# Patient Record
Sex: Female | Born: 1970 | Race: White | Hispanic: No | Marital: Married | Smoking: Never smoker
Health system: Southern US, Community
[De-identification: ages and names within clinical notes are randomized; demographics above are authoritative.]

## PROBLEM LIST (undated history)

## (undated) DIAGNOSIS — K5190 Ulcerative colitis, unspecified, without complications: Secondary | ICD-10-CM

## (undated) DIAGNOSIS — Z86018 Personal history of other benign neoplasm: Secondary | ICD-10-CM

## (undated) DIAGNOSIS — J45990 Exercise induced bronchospasm: Secondary | ICD-10-CM

## (undated) HISTORY — DX: Exercise induced bronchospasm: J45.990

## (undated) HISTORY — PX: OTHER SURGICAL HISTORY: SHX169

## (undated) HISTORY — DX: Ulcerative colitis, unspecified, without complications: K51.90

---
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
## 1994-09-03 DIAGNOSIS — K5190 Ulcerative colitis, unspecified, without complications: Secondary | ICD-10-CM

## 1994-09-03 HISTORY — DX: Ulcerative colitis, unspecified, without complications: K51.90

## 1995-09-04 HISTORY — PX: OTHER SURGICAL HISTORY: SHX169

## 2001-09-03 DIAGNOSIS — Z86018 Personal history of other benign neoplasm: Secondary | ICD-10-CM

## 2001-09-03 HISTORY — DX: Personal history of other benign neoplasm: Z86.018

## 2004-08-07 ENCOUNTER — Emergency Department: Payer: Self-pay | Admitting: Emergency Medicine

## 2004-10-07 ENCOUNTER — Emergency Department: Payer: Self-pay | Admitting: Emergency Medicine

## 2005-01-01 HISTORY — PX: TONSILLECTOMY AND ADENOIDECTOMY: SUR1326

## 2005-01-25 ENCOUNTER — Ambulatory Visit: Payer: Self-pay | Admitting: Otolaryngology

## 2005-04-11 ENCOUNTER — Ambulatory Visit: Payer: Self-pay | Admitting: Otolaryngology

## 2005-08-23 ENCOUNTER — Encounter (INDEPENDENT_AMBULATORY_CARE_PROVIDER_SITE_OTHER): Payer: Self-pay | Admitting: Specialist

## 2005-08-23 ENCOUNTER — Observation Stay (HOSPITAL_COMMUNITY): Admission: RE | Admit: 2005-08-23 | Discharge: 2005-08-24 | Payer: Self-pay | Admitting: Gynecology

## 2006-11-11 ENCOUNTER — Ambulatory Visit: Payer: Self-pay | Admitting: Gynecology

## 2007-01-28 ENCOUNTER — Ambulatory Visit: Payer: Self-pay | Admitting: Gynecology

## 2007-01-28 ENCOUNTER — Ambulatory Visit (HOSPITAL_COMMUNITY): Admission: RE | Admit: 2007-01-28 | Discharge: 2007-01-28 | Payer: Self-pay | Admitting: Gynecology

## 2007-05-30 LAB — HM DEXA SCAN: HM DEXA SCAN: NORMAL

## 2007-07-26 ENCOUNTER — Emergency Department: Payer: Self-pay | Admitting: Emergency Medicine

## 2007-08-04 HISTORY — PX: PARTIAL HYSTERECTOMY: SHX80

## 2007-11-03 ENCOUNTER — Ambulatory Visit: Payer: Self-pay | Admitting: Gynecology

## 2008-04-30 ENCOUNTER — Emergency Department: Payer: Self-pay | Admitting: Emergency Medicine

## 2008-11-04 ENCOUNTER — Emergency Department: Payer: Self-pay | Admitting: Emergency Medicine

## 2009-10-05 ENCOUNTER — Ambulatory Visit: Payer: Self-pay | Admitting: Family Medicine

## 2011-01-16 NOTE — Assessment & Plan Note (Signed)
NAMEMELAYNA, ROBARTS NO.:  [id number]   MEDICAL RECORD NO.:  [id number]          PATIENT TYPE:  POB   LOCATION:  [place] [facility] [place]:  Darron Doom, MD        DATE OF BIRTH:  29-Nov-1970   DATE OF SERVICE:  10/05/2009                                  CLINIC NOTE   CHIEF COMPLAINT:  Yearly exam and breast issue.   HISTORY OF PRESENT ILLNESS:  The patient is a 40 year old, para 2, who  is an [place].  The patient has a complicated medical history that  includes colectomy and hysterectomy.  She does have history of abnormal  Pap with HPV in the past.  The patient comes in today mostly to complain  about this area of her breast.  She has fibrocystic breast change that  she has noted.  She has been getting mammography yearly since age 15.  However, her last mammogram was in May 2009.  She missed 2010 for some  reason.  The patient has noted some pain and increase in nodularity at  the 12 o'clock position of her left breast.  There is fibrocystic change  there which she says on feeling it is more round and firm there that has  her worry.  The patient is also interested in BRCA testing.  The patient  has a family history of breast cancer in her grandmother, who passed at  age 63 from breast cancer.  Her mother also had a double mastectomy for  benign tumors at age 51.  There is also a family history of colon cancer  and melanoma.   PAST MEDICAL HISTORY:  Significant for allergies, anxiety, and  depression, ulcerative colitis.   PAST SURGICAL HISTORY:  She has had varicose vein repair, colectomy,  hysterectomy, vaginal cuff revision, tonsillectomy, sinus surgery.   MEDICATIONS:  1. She is on Zyrtec 1 p.o. daily.  2. Trazodone 1 p.o. daily.  3. Zoloft 1 p.o. daily.  4. Lomotil 1 p.o. daily.  5. Singulair 1 p.o. daily.   ALLERGIES:  Multiple and to SULFA, DEMEROL, BENTYL, BETADINE, and  FLAGYL.   OBSTETRICAL HISTORY:   She has had 2 deliveries.  She is status post  tubal following the birth of her last child.   GYNECOLOGIC HISTORY:  History of abnormal Pap with HPV multiple  occasions.  She is status post TVH with normal Pap.  However, with a  history of HPV probably consider continued Pap smears for the next 20  years.   FAMILY HISTORY:  For breast cancer in the young age in her grandmother,  bilateral mastectomy by her mom for tumors that more likely benign,  history of melanoma, history of colon cancer, strong history of  ulcerative colitis.   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  Her husband teaches Haematologist  at [employer].   REVIEW OF SYSTEMS:  Review is negative for fever, chills, headache,  vision changes, shortness of breath, chest pain, chronic abdominal pain,  some mild fecal incontinence at times, and she  has had ileoanal pull-  through.  Varicose veins as mentioned above.  No dysuria, no melanoma.   PHYSICAL EXAMINATION:  VITAL SIGNS:  Today, her weight is 117, blood  pressure 118/77, pulse 83.  GENERAL:  She is a well-developed, well-nourished female, in no acute  distress.  HEENT:  Normocephalic, atraumatic.  Sclerae anicteric.  NECK:  Supple.  Normal thyroid.  LUNGS:  Clear bilaterally.  CV:  Regular rate and rhythm.  No rubs, gallops, or murmurs.  ABDOMEN:  Soft, nontender, nondistended.  Well-healed midline incision  is noted.  EXTREMITIES:  No cyanosis, clubbing, or edema.  BREASTS:  Symmetric with everted nipples.  She has diffuse fibrocystic  change left greater than right breast.  There is a discrete 1 x 1 cm  firm mobile, rubbery mass noted at approximately 12 o'clock  position.  It has heartening to say this is from surrounding fibrocystic change  except that the patient notices it.  There is no supraclavicular or  axillary adenopathy noted.  GU:  Normal external female genitalia.  BUS is  normal.  Vagina is pink  and rugated.  Cervix and uterus are absent.  No adnexal mass or  tenderness were noted.  The ovaries cannot be felt constant.   IMPRESSION:  1. Yearly exam.  2. Breast mass.  3. Strong family history of cancer, Ashkenazi Jew ancestry.  4. History of abnormal Pap.   PLAN:  1. BRCA testing today.  2. Pap smear should be done for 20 years post hysterectomy.  3. Diagnostic mammogram.  Consider MRI if this comes back normal.  We      will follow up on these results and order subsequent tests as      necessary.           ______________________________  Darron Doom, MD     TP/MEDQ  D:  10/05/2009  T:  10/06/2009  Job:  [id number]

## 2011-01-19 NOTE — Discharge Summary (Signed)
NAMEBLAYKLEE, MABLE NO.:  [id number]   MEDICAL RECORD NO.:  [id number]          PATIENT TYPE:  OBV   LOCATION:  6789                          FACILITY:  [facility]   PHYSICIAN:  Willey Blade, MD  DATE OF BIRTH:  1970/12/22   DATE OF ADMISSION:  08/23/2005  DATE OF DISCHARGE:  08/24/2005                                 DISCHARGE SUMMARY   REASON FOR HOSPITALIZATION:  Menometrorrhagia, severe dysmenorrhea.   PROCEDURE:  Total vaginal hysterectomy with preservation of both tubes and  ovaries.   FINAL DIAGNOSIS:  Menometrorrhagia, severe dysmenorrhea.   HOSPITAL COURSE:  This is a 40 year old multiparous Caucasian female who  underwent the aforementioned procedure on August 23, 2005.  The patient  had appropriate work up for menometrorrhagia.  Intraoperative course was  unremarkable. Postoperatively the patient's course was uneventful.  She was  afebrile.  The patient voided well and catheter was removed on the same  postoperative day per patient request.  Hemoglobin was 9.6 and hematocrit  28.1 postoperatively.  Minimal pelvic pain reported by patient.  No vaginal  bleeding. Abdomen was soft.  Calves without tenderness.  Lungs were clear.   The patient was discharged with routine instructions including contacting  the office for temperature elevation about 100.52F, increasing abdomen or  vaginal pain, difficulty with bowel movements or genitourinary symptoms.  Percocet 5/500, one to two every 4 to 6 hours prescribed for pain.  Phenergan 25 mg one-half to one tablet every 4 to 6 hours prescribed for  nausea and vomiting.  She will return to the office in four to five weeks to  have her postoperative evaluation.      Willey Blade, MD  Electronically Signed     SHB/MEDQ  D:  08/24/2005  T:  08/25/2005  Job:  [id number]

## 2011-01-19 NOTE — Op Note (Signed)
Mariah Nguyen, Mariah Nguyen NO.:  [id number]   MEDICAL RECORD NO.:  [id number]          PATIENT TYPE:  OBV   LOCATION:  9399                          FACILITY:  [facility]   PHYSICIAN:  Willey Blade, MD  DATE OF BIRTH:  05-16-1971   DATE OF PROCEDURE:  08/23/2005  DATE OF DISCHARGE:                                 OPERATIVE REPORT   PREOPERATIVE DIAGNOSIS:  Menometrorrhagia   POSTOPERATIVE DIAGNOSIS:  Menometrorrhagia   PROCEDURE:  Total vaginal hysterectomy, preservation of both tubes and  ovaries.   SURGEON:  Willey Blade, M.D.   ASSISTANT:  None.   COMPLICATIONS:  None immediate.   ESTIMATED BLOOD LOSS:  Less than 50 mL.   SPECIMEN:  Uterus, cervix.   ANESTHESIA:  Spinal with 0.5% Marcaine with epinephrine 1:200,000, 30 mL  paracervical block.   OPERATIVE FINDINGS:  Uterus was 6-9 weeks in size, globular. Both adnexa  were normal.   OPERATIVE PROCEDURE:  The patient prepped and draped in the usual fashion  and placed in the lithotomy position, Betadine solution used for antiseptic  and the patient was catheterized prior to procedure. After adequate spinal  analgesia, paracervical block was administered. The anterior posterior  vaginal epithelium were incised transversely. Peritoneal reflections were  carefully identified and opened without injury to the respective organs.  Uterosacral cardinal ligament complexes clamped, cut and ligated with 0  Vicryl suture. This extended to the uterine vasculature with its ascending  branches. Round ligaments taken separately. Utero-ovarian ligaments  transfixated with 0 Vicryl sutures twice. Bleeding points hemostatically  checked. Blood clots removed. Closure of the cuff in one layer including  peritoneum with 0 Vicryl running interlocking suture from either end to  midline. The patient tolerated the procedure well, returned to post  anesthesia recovery room in excellent condition.      Willey Blade,  MD  Electronically Signed     SHB/MEDQ  D:  08/23/2005  T:  08/24/2005  Job:  [id number]

## 2011-01-19 NOTE — H&P (Signed)
Mariah Nguyen, Mariah Nguyen NO.:  [id number]   MEDICAL RECORD NO.:  [id number]          PATIENT TYPE:  AMB   LOCATION:  [place]                           FACILITY:  [facility]   PHYSICIAN:  Willey Blade, MD  DATE OF BIRTH:  05/22/1971   DATE OF ADMISSION:  DATE OF DISCHARGE:                                HISTORY & PHYSICAL   DATE OF SURGERY:  Scheduled for August 23, 2005.   REASON FOR HOSPITALIZATION:  Menometrorrhagia.   IN HOSPITAL PROPOSED PROCEDURE:  Total vaginal hysterectomy with  preservation of both tubes and ovaries.   HOSPITAL COURSE:  This patient is a 40 year old gravida 3, para 2-0-1-2  Caucasian female admitted for a total vaginal hysterectomy and preservation  of both tubes and ovaries, because of menometrorrhagia and severe  dysmenorrhea.  The patient complains of menses lasting 8 to 12 days in a 30-  day calendar month.  The patient states that she has been unable to utilize  oral contraceptives in management due to headaches and nausea.  An  intrauterine device, Mirena,  has been unsuccessful at managing bleeding.  She has taken a three month course of norethindrone acetate without benefit.  Transvaginal ultrasound demonstrates no evidence for intramural pathology  and SIS was negative for intracavitary lesions. Endometrial biopsy was  negative for neoplasia or hyperplasia.  The patient takes no medications to  enhance her bleeding propensity and has no personal or family history of  bleeding diatheses.   OBSTETRIC/GYNECOLOGIC HISTORY:  The patient has had two cesarean section and  one dilatation curettage for a first trimester incomplete abortion.  Method  of birth is control bilateral tubal ligation.   ALLERGIES:  SULFA, DEMEROL, IODINE, BENTYL AND FLAGYL.   CURRENT MEDICATIONS:  1.  Zoloft 18 mg daily.  2.  Trazodone 75 mg at night as needed.  3.  Zyrtec 10 mg as needed.  4.  Cipro 250 mg as needed for control of diarrhea.   MEDICAL  HISTORY:  The patient has had ulcerative colitis in the past.   SURGICAL HISTORY:  Total colectomy with rectal pouch preservation in 1997.   SOCIAL HISTORY:  Denies alcohol, drug abuse, or smoking.   FAMILY HISTORY:  Negative for first- degree relatives with breast, colon,  ovarian, or uterine carcinoma.   REVIEW OF SYSTEMS:  Negative.   PHYSICAL EXAMINATION:  VITAL SIGNS:  Blood pressure 104/64, pulse 76 and  regular, weight 114 pounds, height 5 feet, 3 inches.  HEENT:  Grossly normal.  BREAST EXAMINATION:  Without mass or discharge, thickening or tenderness.  CHEST:  Clear to percussion and auscultation.  CARDIOVASCULAR EXAMINATION:  Without murmurs or enlargements.  Regular rate  and rhythm.  VASCULAR, EXTREMITY, LYMPHATIC, NEUROLOGICAL, MUSCULOSKELETAL SYSTEMS, AND  SKIN:  All normal.  ABDOMEN:  Soft without gross hepatosplenomegaly.  PELVIC EXAMINATION:  External genitalia, vulva and vagina normal.  Cervix  smooth without erosions or lesions.  Uterus is small, anteverted and flexed.  Both adnexa palpable and found to be normal.  RECTAL EXAMINATION:  Hemoccult negative without masses.  Pap smear normal.   IMPRESSION:  Menometrorrhagia with severe dysmenorrhea.   PLAN:  The patient utilizes occasional Vicodin for pain, but otherwise has  been unresponsive to medical management for abnormal bleeding.  Patient  declines the use of an endometrial thermal balloon ablation, and/or a  NovaSure technique.  She has no desire for further childbearing, and wishes  to have complete cessation of the bleeding and cramping.  The patient has  utilized nonsteroidal anti-inflammatory agents which upset her stomach, and  therefore minimizes said use.  A total vaginal hysterectomy with  preservation of both tubes and ovaries was agreed to and will be performed  for said patient.  Risks, including possible injuries to ureter, bowel and  bladder, possible conversion to a laparoscopic or open  procedure, hemorrhage  possibly requiring a blood transfusion, infection, pulmonary complications,  and other unforeseen complications, discussed understood by said patient.      Willey Blade, MD  Electronically Signed     SHB/MEDQ  D:  08/21/2005  T:  08/21/2005  Job:  [id number]

## 2011-01-19 NOTE — Op Note (Signed)
Mariah Nguyen, Mariah Nguyen NO.:  [id number]   MEDICAL RECORD NO.:  [id number]          PATIENT TYPE:  AMB   LOCATION:  [place]                           FACILITY:  [facility]   PHYSICIAN:  Willey Blade, MD  DATE OF BIRTH:  [birth year]   DATE OF PROCEDURE:  01/28/2007  DATE OF DISCHARGE:                               OPERATIVE REPORT   PREOPERATIVE DIAGNOSIS:  Vaginal cuff granulation tissue.   POSTOPERATIVE DIAGNOSIS:  Vaginal cuff granulation tissue.   PROCEDURE:  Fulguration of vaginal cuff granulation tissue.   SURGEON:  Willey Blade, MD   ASSISTANT:  None.   COMPLICATIONS:  None immediate.   ESTIMATED BLOOD LOSS:  Minimal.   SPECIMEN:  None.   OPERATIVE FINDINGS:  External genitalia, vulva and vagina appeared  grossly normal.  Vaginal cuff suture line granulation tissue noted along  the edge of said cuff secondary to previous total vaginal hysterectomy.   OPERATIVE PROCEDURE:  The patient was prepped and draped in the usual  fashion and placed in the lithotomy position.  Hibiclens was used for  antiseptic.  After placing a speculum in the vagina, cuff tissue was  cauterized.  The surface was then wiped clean with lactated Ringer's to  avoid eschar, no active bleeding noted.  The patient tolerated the  procedure well and returned to post anesthesia recovery room in  excellent condition.      Willey Blade, MD  Electronically Signed     SHB/MEDQ  D:  01/28/2007  T:  01/28/2007  Job:  [id number]

## 2011-06-22 DIAGNOSIS — K91850 Pouchitis: Secondary | ICD-10-CM | POA: Insufficient documentation

## 2012-01-10 ENCOUNTER — Emergency Department: Payer: Self-pay | Admitting: Emergency Medicine

## 2012-01-10 LAB — CBC
HCT: 50.9 % — ABNORMAL HIGH (ref 35.0–47.0)
HGB: 16.8 g/dL — ABNORMAL HIGH (ref 12.0–16.0)
MCHC: 33 g/dL (ref 32.0–36.0)
RDW: 13.6 % (ref 11.5–14.5)
WBC: 17.5 10*3/uL — ABNORMAL HIGH (ref 3.6–11.0)

## 2012-01-10 LAB — COMPREHENSIVE METABOLIC PANEL
Albumin: 4.5 g/dL (ref 3.4–5.0)
Anion Gap: 10 (ref 7–16)
Calcium, Total: 9.3 mg/dL (ref 8.5–10.1)
Chloride: 103 mmol/L (ref 98–107)
Creatinine: 1.13 mg/dL (ref 0.60–1.30)
EGFR (Non-African Amer.): 60 — ABNORMAL LOW
SGOT(AST): 45 U/L — ABNORMAL HIGH (ref 15–37)
SGPT (ALT): 29 U/L
Sodium: 133 mmol/L — ABNORMAL LOW (ref 136–145)

## 2012-01-11 ENCOUNTER — Emergency Department: Payer: Self-pay | Admitting: Internal Medicine

## 2012-01-11 LAB — URINALYSIS, COMPLETE
Bacteria: NONE SEEN
Blood: NEGATIVE
Leukocyte Esterase: NEGATIVE
Nitrite: NEGATIVE
Ph: 5 (ref 4.5–8.0)
Protein: NEGATIVE
RBC,UR: <1 /HPF (ref 0–5)

## 2012-01-11 LAB — CBC
HGB: 13.2 g/dL (ref 12.0–16.0)
MCHC: 32.9 g/dL (ref 32.0–36.0)
RBC: 4.73 10*6/uL (ref 3.80–5.20)
WBC: 11.5 10*3/uL — ABNORMAL HIGH (ref 3.6–11.0)

## 2012-01-11 LAB — COMPREHENSIVE METABOLIC PANEL
Albumin: 3.3 g/dL — ABNORMAL LOW (ref 3.4–5.0)
Anion Gap: 7 (ref 7–16)
Calcium, Total: 7.9 mg/dL — ABNORMAL LOW (ref 8.5–10.1)
Creatinine: 0.9 mg/dL (ref 0.60–1.30)
Osmolality: 274 (ref 275–301)
SGOT(AST): 20 U/L (ref 15–37)
SGPT (ALT): 21 U/L

## 2012-01-11 LAB — OCCULT BLOOD X 1 CARD TO LAB, STOOL: Occult Blood, Feces: POSITIVE

## 2012-01-13 LAB — STOOL CULTURE

## 2012-07-01 DIAGNOSIS — Z9109 Other allergy status, other than to drugs and biological substances: Secondary | ICD-10-CM | POA: Insufficient documentation

## 2012-07-15 HISTORY — PX: OTHER SURGICAL HISTORY: SHX169

## 2012-10-18 ENCOUNTER — Emergency Department: Payer: Self-pay | Admitting: Emergency Medicine

## 2012-10-18 LAB — CBC
HCT: 47.8 % — ABNORMAL HIGH (ref 35.0–47.0)
HGB: 15.8 g/dL (ref 12.0–16.0)
MCH: 28.5 pg (ref 26.0–34.0)
MCV: 86 fL (ref 80–100)
RBC: 5.56 10*6/uL — ABNORMAL HIGH (ref 3.80–5.20)
RDW: 13.8 % (ref 11.5–14.5)
WBC: 10.4 10*3/uL (ref 3.6–11.0)

## 2012-10-18 LAB — COMPREHENSIVE METABOLIC PANEL
Albumin: 3.9 g/dL (ref 3.4–5.0)
Anion Gap: 7 (ref 7–16)
Bilirubin,Total: 0.4 mg/dL (ref 0.2–1.0)
Calcium, Total: 9 mg/dL (ref 8.5–10.1)
EGFR (Non-African Amer.): >60
Osmolality: 274 (ref 275–301)
Potassium: 4.3 mmol/L (ref 3.5–5.1)
SGOT(AST): 35 U/L (ref 15–37)
SGPT (ALT): 28 U/L (ref 12–78)
Total Protein: 8.3 g/dL — ABNORMAL HIGH (ref 6.4–8.2)

## 2012-10-18 LAB — URINALYSIS, COMPLETE
Bacteria: NONE SEEN
Bilirubin,UR: NEGATIVE
Ketone: NEGATIVE
Ph: 6 (ref 4.5–8.0)
RBC,UR: <1 /HPF (ref 0–5)
Specific Gravity: 1.006 (ref 1.003–1.030)
Squamous Epithelial: NONE SEEN

## 2013-01-08 DIAGNOSIS — Z8719 Personal history of other diseases of the digestive system: Secondary | ICD-10-CM | POA: Insufficient documentation

## 2013-01-08 DIAGNOSIS — K5100 Ulcerative (chronic) pancolitis without complications: Secondary | ICD-10-CM | POA: Insufficient documentation

## 2013-02-11 LAB — HM MAMMOGRAPHY

## 2013-02-19 DIAGNOSIS — Z803 Family history of malignant neoplasm of breast: Secondary | ICD-10-CM | POA: Insufficient documentation

## 2013-10-19 DIAGNOSIS — K529 Noninfective gastroenteritis and colitis, unspecified: Secondary | ICD-10-CM | POA: Insufficient documentation

## 2014-01-21 DIAGNOSIS — I493 Ventricular premature depolarization: Secondary | ICD-10-CM | POA: Insufficient documentation

## 2015-06-09 ENCOUNTER — Ambulatory Visit (INDEPENDENT_AMBULATORY_CARE_PROVIDER_SITE_OTHER): Payer: BLUE CROSS/BLUE SHIELD | Admitting: Family Medicine

## 2015-06-09 ENCOUNTER — Encounter: Payer: Self-pay | Admitting: Family Medicine

## 2015-06-09 ENCOUNTER — Other Ambulatory Visit: Payer: Self-pay | Admitting: Family Medicine

## 2015-06-09 ENCOUNTER — Other Ambulatory Visit: Payer: Self-pay

## 2015-06-09 ENCOUNTER — Telehealth: Payer: Self-pay | Admitting: Family Medicine

## 2015-06-09 VITALS — BP 104/76 | HR 91 | Temp 98.0°F | Resp 16 | Ht 63.5 in | Wt 130.4 lb

## 2015-06-09 DIAGNOSIS — I4891 Unspecified atrial fibrillation: Secondary | ICD-10-CM | POA: Insufficient documentation

## 2015-06-09 DIAGNOSIS — G4700 Insomnia, unspecified: Secondary | ICD-10-CM | POA: Insufficient documentation

## 2015-06-09 DIAGNOSIS — J45909 Unspecified asthma, uncomplicated: Secondary | ICD-10-CM | POA: Insufficient documentation

## 2015-06-09 DIAGNOSIS — G43909 Migraine, unspecified, not intractable, without status migrainosus: Secondary | ICD-10-CM | POA: Insufficient documentation

## 2015-06-09 DIAGNOSIS — F32A Depression, unspecified: Secondary | ICD-10-CM | POA: Insufficient documentation

## 2015-06-09 DIAGNOSIS — F419 Anxiety disorder, unspecified: Secondary | ICD-10-CM | POA: Insufficient documentation

## 2015-06-09 DIAGNOSIS — J4520 Mild intermittent asthma, uncomplicated: Secondary | ICD-10-CM

## 2015-06-09 DIAGNOSIS — K51019 Ulcerative (chronic) pancolitis with unspecified complications: Secondary | ICD-10-CM

## 2015-06-09 DIAGNOSIS — J45990 Exercise induced bronchospasm: Secondary | ICD-10-CM

## 2015-06-09 DIAGNOSIS — I4892 Unspecified atrial flutter: Secondary | ICD-10-CM

## 2015-06-09 DIAGNOSIS — IMO0002 Reserved for concepts with insufficient information to code with codable children: Secondary | ICD-10-CM | POA: Insufficient documentation

## 2015-06-09 DIAGNOSIS — F329 Major depressive disorder, single episode, unspecified: Secondary | ICD-10-CM | POA: Insufficient documentation

## 2015-06-09 MED ORDER — ALBUTEROL SULFATE HFA 108 (90 BASE) MCG/ACT IN AERS: 2.0000 | INHALATION_SPRAY | Freq: Four times a day (QID) | RESPIRATORY_TRACT | Status: DC | PRN

## 2015-06-09 MED ORDER — BECLOMETHASONE DIPROPIONATE 40 MCG/ACT IN AERS: 2.0000 | INHALATION_SPRAY | Freq: Two times a day (BID) | RESPIRATORY_TRACT | Status: DC | PRN

## 2015-06-09 NOTE — Patient Instructions (Signed)
Exercise-Induced Asthma  Asthma is a condition in which the airways in the lungs (bronchioles) tend to constrict more than normal due to muscle spasms. This constriction results in difficulty in breathing (shortness of breath, wheezing, or coughing). For some people the symptoms are caused or triggered by physical activity; this is known as exercise-induced asthma. SYMPTOMS   Shortness of breath.  Wheezing.  Coughing.  Chest tightness.  Decrease in optimal performance.  Fatigue. POSSIBLE TRIGGERS: Exercise-induced asthma may occur more often when one or more of the following are present:   Animal dander from the skin, hair, or feathers of animals.  Dust mites contained in house dust.  Cockroaches.  Pollen from trees or grass.  Mold.  Cigarette or tobacco smoke. Smoking cannot be allowed in homes of people with asthma. People with asthma should not smoke and should not be around smokers.  Air pollutants such as dust, household cleaners, hair sprays, aerosol sprays, paint fumes, strong chemicals, or strong odors.  Cold air or weather changes. Cold air may cause inflammation. Winds increase molds and pollens in the air. There is not one best climate for people with asthma.  Strong emotions, such as crying or laughing hard.  Stress.  Certain medicines, such as aspirin or beta-blockers.  Sulfites in foods and drinks, such as dried fruits and wine.  Infections or inflammatory conditions such as the flu, a cold, or an inflammation of the nasal membranes (rhinitis).  Gastroesophageal reflux disease (GERD). GERD is a condition where stomach acid backs up into your throat (esophagus).  Exercise or strenuous activity. Proper pre-exercise medicines allow most people to participate in sports. PREVENTION   Know the triggers that may increase your occurrence for exercise-induced asthma and avoid them.  During winter you may need to exercise indoors or wear a mask if you do  exercise outdoors.  Breathing through the nose instead of the mouth, especially in the winter.  Warm up for an appropriate length of time before a vigorous workout.  Take controller and reliever medicines to control your asthma as directed.  Follow up with your caregiver as directed. TREATMENT  Asthma controller and reliever medicines work well for most people suffering from exercise-induced asthma. Medicines are able to prevent asthma attacks as well as treat attacks already happening. The most common type of medicine for asthma is called a bronchodilator. Bronchodilators act by expanding the constricted airways. The most common type of bronchodilator is albuterol and should be taken 15 to 30 minutes before physical activity and as soon as symptoms begin to appear. Additional medicines, such as cromolyn and nedocromil, may be prescribed by your caregiver. It is important for all people with asthma to use their medicines as directed by their caregiver.   This information is not intended to replace advice given to you by your health care provider. Make sure you discuss any questions you have with your health care provider.   Document Released: 08/20/2005 Document Revised: 09/10/2014 Document Reviewed: 12/02/2008 Elsevier Interactive Patient Education [employer].

## 2015-06-09 NOTE — Telephone Encounter (Signed)
Last OV 11/2012  Thanks,   -Mickel Baas

## 2015-06-09 NOTE — Progress Notes (Signed)
Patient ID: SHANIQUIA BRAFFORD, female   DOB: 1971-01-19, 44 y.o.   MRN: [id number] Name: Mariah Nguyen   MRN: [id number]    DOB: [birth year]   Date:06/09/2015       Progress Note  Subjective  Chief Complaint  Chief Complaint  Patient presents with  . Asthma  . Medication Refill    Asthma She complains of cough, shortness of breath and wheezing. This is a chronic problem. The current episode started yesterday (after playing tennis and being exposed to bleach after the maid service cleaned the bathroom). The problem occurs intermittently. The problem has been gradually improving. The cough is non-productive. Pertinent negatives include no ear pain, fever, headaches, nasal congestion, postnasal drip, rhinorrhea or sore throat. Her symptoms are aggravated by exercise and pollen. Her symptoms are alleviated by steroid inhaler and beta-agonist. She reports significant improvement on treatment. Her past medical history is significant for asthma. 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   Past Surgical History  Procedure Laterality Date  . Total proctocolectomy with j-pouch  1997    History of recurrent pouchitis  . Partial hysterectomy  08/2007  . Tonsillectomy and adenoidectomy  01/2005  . Cesarean section  2002  . Right eye surgery  07/15/12    [facility]  . Stripping of varicose veins in legs      Patient Active Problem List   Diagnosis Date Noted  . Asthma 06/09/2015  . Anxiety 06/09/2015  . Clinical depression 06/09/2015  . Cannot sleep 06/09/2015  . Migraine 06/09/2015  . 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 Beat, premature ventricular 01/21/2014  . Bowel disease, inflammatory 10/19/2013  . Family history of breast cancer 02/19/2013  . Chronic ulcerative enterocolitis ([place]) 01/08/2013  . Allergy to environmental factors 07/01/2012  . 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  Social History  Substance  Use Topics  . Smoking status: Never Smoker   . Smokeless tobacco: Never Used  . Alcohol Use: 0.0 oz/week    0 Standard drinks or equivalent per week     Comment: Occasionally    Current outpatient prescriptions:  .  Adalimumab (HUMIRA [place]), Inject into the skin., Disp: , Rfl:  .  albuterol (VENTOLIN HFA) 108 (90 BASE) MCG/ACT inhaler, Inhale into the lungs., Disp: , Rfl:  .  ALPRAZolam (XANAX) 0.5 MG tablet, Take by mouth., Disp: , Rfl:  .  beclomethasone (QVAR) 40 MCG/ACT inhaler, Inhale into the lungs., Disp: , Rfl:  .  busPIRone (BUSPAR) 15 MG tablet, Take by mouth., Disp: , Rfl:  .  cetirizine (ZYRTEC ALLERGY) 10 MG tablet, Take by mouth., Disp: , Rfl:  .  cyclobenzaprine (FLEXERIL) 10 MG tablet, Take by mouth., Disp: , Rfl:  .  cycloSPORINE (RESTASIS) 0.05 % ophthalmic emulsion, Apply to eye., Disp: , Rfl:  .  diphenoxylate-atropine (LOMOTIL) 2.5-0.025 MG tablet, Take by mouth., Disp: , Rfl:  .  EPINEPHrine (EPIPEN 2-PAK) 0.3 mg/0.3 mL IJ SOAJ injection, EPIPEN 2-PAK, 0.3MG /0.3ML (Injection Solution Auto-injector)  1 (one) Soln Auto-inj as directed for 0 days  Quantity: 1;  Refills: 0   Ordered :13-Jan-2015  Margarita Rana MD;  Started 13-Jan-2015 Active Comments: Needs ov scheduled. Thanks- Dr. Jerilynn Mages., Disp: , Rfl:  .  metroNIDAZOLE (FLAGYL) 500 MG tablet, Take by mouth., Disp: , Rfl:  .  montelukast (SINGULAIR) 10 MG tablet, Take by mouth., Disp: , Rfl:  .  promethazine (PHENERGAN) 25 MG tablet, Take by mouth., Disp: ,  Rfl:  .  sertraline (ZOLOFT) 25 MG tablet, Take by mouth., Disp: , Rfl:  .  sertraline (ZOLOFT) 50 MG tablet, Take by mouth., Disp: , Rfl:  .  traZODone (DESYREL) 100 MG tablet, Take by mouth., Disp: , Rfl:   Allergies  Allergen Reactions  . Bentyl  [Dicyclomine]   . Meperidine   . Metronidazole   . Povidone Iodine   . Sulfa Antibiotics    Review of Systems  Constitutional: Negative.  Negative for fever.  HENT: Negative.  Negative for ear pain, postnasal drip,  rhinorrhea and sore throat.   Eyes: Negative.   Respiratory: Positive for cough, shortness of breath and wheezing.   Cardiovascular: Negative.   Gastrointestinal: Negative.   Genitourinary: Negative.   Musculoskeletal: Negative.   Neurological: Negative.  Negative for headaches.  Endo/Heme/Allergies: Negative.   Psychiatric/Behavioral: Negative.    Objective  Filed Vitals:   06/09/15 1013  BP: 104/76  Pulse: 91  Temp: 98 F (36.7 C)  TempSrc: Oral  Resp: 16  Height: 5' 3.5" (1.613 m)  Weight: 130 lb 6.4 oz (59.149 kg)  SpO2: 95%  Body mass index is 22.73 kg/(m^2).  Physical Exam  Constitutional: She is oriented to person, place, and time and well-developed, well-nourished, and in no distress.  HENT:  Head: Normocephalic.  Right Ear: External ear normal.  Left Ear: External ear normal.  Mouth/Throat: Oropharynx is clear and moist.  Eyes: Conjunctivae and EOM are normal.  Neck: Normal range of motion. Neck supple.  Cardiovascular: Normal rate and regular rhythm.   Pulmonary/Chest: Effort normal and breath sounds normal.  Abdominal: Soft. Bowel sounds are normal.  Musculoskeletal: Normal range of motion.  Lymphadenopathy:    She has no cervical adenopathy.  Neurological: She is alert and oriented to person, place, and time.  Skin: No rash noted.   Assessment & Plan  1. Exercise-induced asthma with acute exacerbation Recent flare with increase in exercise (playing tennis) and exposure to bleach vapors. Ventolin she had at home was expired and did not give any relief. Found another inhaler at home and feeling better today. Will refill Ventolin and QVAR (reminded her to rinse mouth out after use of ICS). Recheck prn. - albuterol (VENTOLIN HFA) 108 (90 BASE) MCG/ACT inhaler; Inhale 2 puffs into the lungs every 6 (six) hours as needed for wheezing or shortness of breath.  Dispense: 1 Inhaler; Refill: 2 - beclomethasone (QVAR) 40 MCG/ACT inhaler; Inhale 2 puffs into the lungs  2 (two) times daily as needed.  Dispense: 1 Inhaler; Refill: 2  2. Chronic ulcerative enterocolitis, unspecified complication (HCC) Diagnosed with ulcerative colitis in 1996 and required total proctocolectomy with J-pouch in 1997. 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 Has had TB test at the onset of Humira treatment. Continue regular follow up with gastroenterologist. Always cautioned about infection issues. Recheck as needed.

## 2015-06-10 ENCOUNTER — Ambulatory Visit: Payer: Self-pay | Admitting: Family Medicine

## 2015-07-19 ENCOUNTER — Other Ambulatory Visit: Payer: Self-pay | Admitting: Family Medicine

## 2015-07-19 DIAGNOSIS — J4520 Mild intermittent asthma, uncomplicated: Secondary | ICD-10-CM

## 2015-07-20 ENCOUNTER — Other Ambulatory Visit: Payer: Self-pay | Admitting: Family Medicine

## 2015-07-20 DIAGNOSIS — Z9109 Other allergy status, other than to drugs and biological substances: Secondary | ICD-10-CM

## 2015-09-20 ENCOUNTER — Other Ambulatory Visit: Payer: Self-pay

## 2015-09-27 ENCOUNTER — Encounter: Payer: Self-pay | Admitting: Physician Assistant

## 2015-09-27 ENCOUNTER — Ambulatory Visit (INDEPENDENT_AMBULATORY_CARE_PROVIDER_SITE_OTHER): Payer: BLUE CROSS/BLUE SHIELD | Admitting: Physician Assistant

## 2015-09-27 VITALS — BP 112/60 | HR 86 | Temp 98.7°F | Resp 16 | Wt 132.2 lb

## 2015-09-27 DIAGNOSIS — E038 Other specified hypothyroidism: Secondary | ICD-10-CM | POA: Insufficient documentation

## 2015-09-27 DIAGNOSIS — E039 Hypothyroidism, unspecified: Secondary | ICD-10-CM

## 2015-09-27 MED ORDER — LEVOTHYROXINE SODIUM 25 MCG PO TABS: 25.0000 ug | ORAL_TABLET | Freq: Every day | ORAL | Status: DC

## 2015-09-27 NOTE — Progress Notes (Signed)
[name]    DOB: 04-01-1971   45 y.o.   MRN: [id number] Visit Date: 09/27/2015  Today's Provider: Mar Daring, PA-C   Chief Complaint  Patient presents with  . Discuss Labs   Subjective:    HPI  Mariah Nguyen is here concern about her Thyroid results. Patient had blood test done by her I specialist. She is feeling more tired, feels like her face and hands are more full (sweling), has gained weight.     Allergies  Allergen Reactions  . Bentyl  [Dicyclomine]   . Meperidine   . Metronidazole   . Povidone Iodine   . Sulfa Antibiotics    Previous Medications   ACETAMINOPHEN ER PO       ADALIMUMAB (HUMIRA [place])    Inject into the skin.   ALBUTEROL (VENTOLIN HFA) 108 (90 BASE) MCG/ACT INHALER    Inhale 2 puffs into the lungs every 6 (six) hours as needed for wheezing or shortness of breath.   ALPRAZOLAM (XANAX) 0.5 MG TABLET    Take by mouth.   BECLOMETHASONE (QVAR) 40 MCG/ACT INHALER    Inhale 2 puffs into the lungs 2 (two) times daily as needed.   BUSPIRONE (BUSPAR) 15 MG TABLET    Take by mouth. Reported on 09/27/2015   CETIRIZINE (ZYRTEC ALLERGY) 10 MG TABLET    Take by mouth.   CIPROFLOXACIN (CIPRO) 500 MG TABLET    Take 500 mg by mouth 1 day or 1 dose.   CRANBERRY FRUIT 405 MG CAPS    Take by mouth.   CYCLOBENZAPRINE (FLEXERIL) 10 MG TABLET    Take by mouth.   CYCLOSPORINE (RESTASIS) 0.05 % OPHTHALMIC EMULSION    Apply to eye.   DIPHENOXYLATE-ATROPINE (LOMOTIL) 2.5-0.025 MG TABLET    Take by mouth.   EPINEPHRINE (EPIPEN 2-PAK) 0.3 MG/0.3 ML IJ SOAJ INJECTION    EPIPEN 2-PAK, 0.3MG /0.3ML (Injection Solution Auto-injector)  1 (one) Soln Auto-inj as directed for 0 days  Quantity: 1;  Refills: 0   Ordered :13-Jan-2015  Margarita Rana MD;  Started 13-Jan-2015 Active Comments: Needs ov scheduled. Thanks- Dr. Jerilynn Mages.   METRONIDAZOLE (FLAGYL) 500 MG TABLET    Take by mouth. Reported on 09/27/2015   MONTELUKAST (SINGULAIR) 10 MG TABLET    TAKE 1  TABLET EVERY DAY AS DIRECTED   PROMETHAZINE (PHENERGAN) 25 MG TABLET    Take by mouth. Reported on 09/27/2015   SERTRALINE (ZOLOFT) 25 MG TABLET    Take by mouth.   TRAZODONE (DESYREL) 100 MG TABLET    Take by mouth.   VITAMIN D, ERGOCALCIFEROL, (DRISDOL) 50000 UNITS CAPS CAPSULE        Review of Systems  Constitutional: Positive for activity change, fatigue and unexpected weight change.  HENT: Negative.   Respiratory: Negative.   Cardiovascular: Negative.        Swelling on her hands.  Gastrointestinal: Negative.   Endocrine: Positive for cold intolerance.  Genitourinary: Negative.   Musculoskeletal: Negative.   Skin: Negative.   Allergic/Immunologic: Negative.   Neurological: Negative.   Hematological: Negative.   Psychiatric/Behavioral: Negative.     Social History  Substance Use Topics  . Smoking status: Never Smoker   . Smokeless tobacco: Never Used  . Alcohol Use: 0.0 oz/week    0 Standard drinks or equivalent per week     Comment: Occasionally   Objective:   BP 112/60 mmHg  Pulse 86  Temp(Src) 98.7 F (37.1 C) (Oral)  Resp 16  Wt 132 lb 3.2 oz (59.966 kg)  Physical Exam  Constitutional: She is oriented to person, place, and time. She appears well-developed and well-nourished. No distress.  HENT:  Head: Normocephalic and atraumatic.  Right Ear: Tympanic membrane, external ear and ear canal normal.  Left Ear: Tympanic membrane, external ear and ear canal normal.  Nose: Nose normal.  Mouth/Throat: Uvula is midline, oropharynx is clear and moist and mucous membranes are normal. No oropharyngeal exudate, posterior oropharyngeal edema or posterior oropharyngeal erythema.  Eyes: Conjunctivae and EOM are normal. Pupils are equal, round, and reactive to light. Right eye exhibits no discharge. Left eye exhibits no discharge. No scleral icterus.  Neck: Normal range of motion. Neck supple. No JVD present. No tracheal deviation present. No thyromegaly present.    Cardiovascular: Normal rate, regular rhythm, normal heart sounds and intact distal pulses.  Exam reveals no gallop and no friction rub.   No murmur heard. Pulmonary/Chest: Effort normal and breath sounds normal. No respiratory distress. She has no wheezes. She has no rales. She exhibits no tenderness.  Abdominal: Soft. Bowel sounds are normal. She exhibits no distension and no mass. There is no tenderness. There is no rebound and no guarding.  Musculoskeletal: Normal range of motion. She exhibits no edema or tenderness.  Lymphadenopathy:    She has no cervical adenopathy.  Neurological: She is alert and oriented to person, place, and time.  Skin: Skin is warm and dry. No rash noted. She is not diaphoretic.  Psychiatric: She has a normal mood and affect. Her behavior is normal. Judgment and thought content normal.  Vitals reviewed.       Assessment & Plan:     1. Subclinical hypothyroidism TSH and T4 were okay but T3 was slightly low. I did advise her that we normally don't treat thyroid labs like that but because of all of her symptoms I will give her a low-dose of levothyroxine to see if it helps her symptoms. I will see her back in 3 months to recheck TSH and thyroid panel. She is to call the office if she has any worsening symptoms in the meantime. - levothyroxine (SYNTHROID, LEVOTHROID) 25 MCG tablet; Take 1 tablet (25 mcg total) by mouth daily before breakfast.  Dispense: 30 tablet; Refill: 1 - Thyroid Panel With TSH; Future      Mar Daring, PA-C  [facility]

## 2015-09-28 ENCOUNTER — Encounter: Payer: Self-pay | Admitting: Family Medicine

## 2015-10-07 ENCOUNTER — Ambulatory Visit (INDEPENDENT_AMBULATORY_CARE_PROVIDER_SITE_OTHER): Payer: BLUE CROSS/BLUE SHIELD | Admitting: Family Medicine

## 2015-10-07 ENCOUNTER — Encounter: Payer: Self-pay | Admitting: Family Medicine

## 2015-10-07 VITALS — BP 102/64 | HR 96 | Temp 98.2°F | Resp 16 | Wt 132.0 lb

## 2015-10-07 DIAGNOSIS — M25511 Pain in right shoulder: Secondary | ICD-10-CM | POA: Diagnosis not present

## 2015-10-07 DIAGNOSIS — E038 Other specified hypothyroidism: Secondary | ICD-10-CM | POA: Diagnosis not present

## 2015-10-07 DIAGNOSIS — M25519 Pain in unspecified shoulder: Secondary | ICD-10-CM | POA: Insufficient documentation

## 2015-10-07 DIAGNOSIS — E039 Hypothyroidism, unspecified: Secondary | ICD-10-CM

## 2015-10-07 DIAGNOSIS — M542 Cervicalgia: Secondary | ICD-10-CM | POA: Diagnosis not present

## 2015-10-07 DIAGNOSIS — M25512 Pain in left shoulder: Secondary | ICD-10-CM | POA: Diagnosis not present

## 2015-10-07 NOTE — Progress Notes (Deleted)
   Subjective:    Patient ID: Mariah Nguyen, female    DOB: 04-15-1971, 45 y.o.   MRN: [id number]  HPI  Hypothyroidism Mariah Nguyen is a 45 y.o. female who presents for follow up of hypothyroidism. Current symptoms: {$Symptoms; [phone]} . Patient denies {$symptoms; thyroid (quality):[phone]}. Symptoms have {$Desc; symptom progression:[phone]}. Pt saw Tawanna Sat on 09/27/2015 for subclinical hypothyroidism and started on Levothyroxine 25 mcg due to symptoms.  Review of Systems     Objective:   Physical Exam        Assessment & Plan:
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
## 2015-10-13 ENCOUNTER — Encounter: Payer: Self-pay | Admitting: Family Medicine

## 2015-11-01 ENCOUNTER — Encounter: Payer: Self-pay | Admitting: Physician Assistant

## 2015-11-01 ENCOUNTER — Ambulatory Visit (INDEPENDENT_AMBULATORY_CARE_PROVIDER_SITE_OTHER): Payer: BLUE CROSS/BLUE SHIELD | Admitting: Physician Assistant

## 2015-11-01 VITALS — BP 102/60 | HR 93 | Temp 98.1°F | Resp 16 | Wt 131.6 lb

## 2015-11-01 DIAGNOSIS — E039 Hypothyroidism, unspecified: Secondary | ICD-10-CM

## 2015-11-01 DIAGNOSIS — E038 Other specified hypothyroidism: Secondary | ICD-10-CM | POA: Diagnosis not present

## 2015-11-01 DIAGNOSIS — B379 Candidiasis, unspecified: Secondary | ICD-10-CM | POA: Diagnosis not present

## 2015-11-01 MED ORDER — NYSTATIN 100000 UNIT/ML MT SUSP: 5.0000 mL | Freq: Two times a day (BID) | OROMUCOSAL | Status: DC

## 2015-11-01 MED ORDER — NYSTATIN 100000 UNIT/GM EX POWD: Freq: Two times a day (BID) | CUTANEOUS | Status: DC

## 2015-11-01 MED ORDER — LEVOTHYROXINE SODIUM 25 MCG PO TABS: 25.0000 ug | ORAL_TABLET | Freq: Every day | ORAL | Status: DC

## 2015-11-01 NOTE — Patient Instructions (Signed)

## 2015-11-01 NOTE — Progress Notes (Signed)
[name]    DOB: 11/30/1970   45 y.o.   MRN: [id number] Visit Date: 11/01/2015  Today's Provider: Mar Daring, PA-C   Chief Complaint  Patient presents with  . Follow-up    Thyroid   Subjective:    HPI  Subclinical Hypothyroidism: Present here for follow-up. She states that she is feeling way better with the medicine. Patient reports no anxiety, diaphoresis, heat intolerance, palpitations or weight loss. The symptoms have been improving. She has not had her labs drawn yet as it is still one week to early. She also has a follow-up appointment with GI on Friday. She stopped her Humira due to side effects. She is not seeing any change with it but will talk to her specialist about this on Friday. She has made a lot changes with her diet. She is not drinking any soda, no coffee at all of any kind, chocolate or gum. She has added apple sauce every day and guacamole, green tea twice a day and is feeling better.  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     Allergies  Allergen Reactions  . Bentyl  [Dicyclomine]   . Meperidine   . Metronidazole   . Povidone Iodine   . Sulfa Antibiotics    Previous Medications   ACETAMINOPHEN ER PO       ADALIMUMAB (HUMIRA [place])    Inject into the skin. Reported on 11/01/2015   ALBUTEROL (VENTOLIN HFA) 108 (90 BASE) MCG/ACT INHALER    Inhale 2 puffs into the lungs every 6 (six) hours as needed for wheezing or shortness of breath.   ALPRAZOLAM (XANAX) 0.5 MG TABLET    Take by mouth.   BECLOMETHASONE (QVAR) 40 MCG/ACT INHALER    Inhale 2 puffs into the lungs 2 (two) times daily as needed.   BUSPIRONE (BUSPAR) 15 MG TABLET    Take by mouth. Reported on 09/27/2015   CETIRIZINE (ZYRTEC ALLERGY) 10 MG TABLET    Take by mouth.   CIPROFLOXACIN (CIPRO) 500 MG TABLET    Take 500 mg by mouth 1 day or 1 dose.   CRANBERRY FRUIT 405 MG CAPS    Take by mouth.   CYCLOBENZAPRINE (FLEXERIL) 10 MG TABLET    Take by mouth.   CYCLOSPORINE  (RESTASIS) 0.05 % OPHTHALMIC EMULSION    Apply to eye.   DIPHENOXYLATE-ATROPINE (LOMOTIL) 2.5-0.025 MG TABLET    Take by mouth.   EPINEPHRINE (EPIPEN 2-PAK) 0.3 MG/0.3 ML IJ SOAJ INJECTION    EPIPEN 2-PAK, 0.3MG /0.3ML (Injection Solution Auto-injector)  1 (one) Soln Auto-inj as directed for 0 days  Quantity: 1;  Refills: 0   Ordered :13-Jan-2015  Margarita Rana MD;  Started 13-Jan-2015 Active Comments: Needs ov scheduled. Thanks- Dr. Jerilynn Mages.   LEVOTHYROXINE (SYNTHROID, LEVOTHROID) 25 MCG TABLET    Take 1 tablet (25 mcg total) by mouth daily before breakfast.   METRONIDAZOLE (FLAGYL) 500 MG TABLET    Take by mouth. Reported on 09/27/2015   PROMETHAZINE (PHENERGAN) 25 MG TABLET    Take by mouth. Reported on 09/27/2015   SERTRALINE (ZOLOFT) 25 MG TABLET    Take by mouth.   TRAZODONE (DESYREL) 100 MG TABLET    Take by mouth.   VITAMIN D, ERGOCALCIFEROL, (DRISDOL) 50000 UNITS CAPS CAPSULE        Review of Systems  Constitutional: Negative for fever, chills and fatigue.  HENT: Negative.   Respiratory: Negative for cough, chest tightness and shortness of breath.  Cardiovascular: Negative for chest pain.  Gastrointestinal: Negative for nausea, vomiting and abdominal pain.  Endocrine: Negative for cold intolerance, heat intolerance, polydipsia and polyphagia.  Neurological: Negative for dizziness and headaches.    Social History  Substance Use Topics  . Smoking status: Never Smoker   . Smokeless tobacco: Never Used  . Alcohol Use: 0.0 oz/week    0 Standard drinks or equivalent per week     Comment: Occasionally   Objective:   BP 102/60 mmHg  Pulse 93  Temp(Src) 98.1 F (36.7 C) (Oral)  Resp 16  Wt 131 lb 9.6 oz (59.693 kg)  Physical Exam  Constitutional: She appears well-developed and well-nourished. No distress.  Neck: Normal range of motion. Neck supple. No JVD present. No tracheal deviation present. No thyromegaly present.  Cardiovascular: Normal rate, regular rhythm and normal  heart sounds.  Exam reveals no gallop and no friction rub.   No murmur heard. Pulmonary/Chest: Effort normal and breath sounds normal. No respiratory distress. She has no wheezes. She has no rales.  Lymphadenopathy:    She has no cervical adenopathy.  Skin: She is not diaphoretic.  Vitals reviewed.       Assessment & Plan:     1. Subclinical hypothyroidism Continue current dose of levothyroxine as symptoms have subsided.  WIll follow up pending lab results next week. She is to call if she has any acute issue, question or concern.  If not, I will see her back in 6 months for re-evaluation of her hypothyroid.  - levothyroxine (SYNTHROID, LEVOTHROID) 25 MCG tablet; Take 1 tablet (25 mcg total) by mouth daily before breakfast.  Dispense: 30 tablet; Refill: 3  2. Yeast infection States she gets oral yeast infections secondary to frequent antibiotic treatments for her ileal pouchitis. She has used nystatin powder successfully. We discussed trying the oral suspension instead for better relief and ease of use for oral yeast infection.  She is willing to try this but would also like the powder sent in as well in case she does not like the oral suspension or if it does not work as it had previously.  She is to call the office if symptoms worsen. - nystatin (MYCOSTATIN) 100000 UNIT/ML suspension; Take 5 mLs (500,000 Units total) by mouth 2 (two) times daily.  Dispense: 60 mL; Refill: 0 - nystatin (MYCOSTATIN) powder; Apply topically 2 (two) times daily.  Dispense: 15 g; Refill: 0       Mar Daring, PA-C  [facility]

## 2015-11-04 DIAGNOSIS — Z9049 Acquired absence of other specified parts of digestive tract: Secondary | ICD-10-CM | POA: Insufficient documentation

## 2015-11-14 ENCOUNTER — Other Ambulatory Visit: Payer: Self-pay | Admitting: Physician Assistant

## 2015-11-15 LAB — THYROID PANEL WITH TSH
Free Thyroxine Index: 2.1 (ref 1.2–4.9)
T3 UPTAKE RATIO: 22 % — AB (ref 24–39)
T4 TOTAL: 9.5 ug/dL (ref 4.5–12.0)
TSH: 1.42 u[IU]/mL (ref 0.450–4.500)

## 2015-11-17 ENCOUNTER — Telehealth: Payer: Self-pay

## 2015-11-17 NOTE — Telephone Encounter (Signed)
-----   Message from Mar Daring, PA-C sent at 11/16/2015  3:34 PM EDT ----- TSH is stable at 1.420. T4 is WNL at 9.5, T3 is 22, free thyroxine is 2.1.  If still doing well may continue levothyroxine at current dose.

## 2015-11-17 NOTE — Telephone Encounter (Signed)
Patient advised as directed below. Patient verbalized understanding.  

## 2015-11-20 ENCOUNTER — Other Ambulatory Visit: Payer: Self-pay | Admitting: Family Medicine

## 2015-11-20 ENCOUNTER — Other Ambulatory Visit: Payer: Self-pay | Admitting: Physician Assistant

## 2015-11-20 DIAGNOSIS — G4700 Insomnia, unspecified: Secondary | ICD-10-CM

## 2015-11-20 DIAGNOSIS — E039 Hypothyroidism, unspecified: Secondary | ICD-10-CM

## 2015-11-20 DIAGNOSIS — E038 Other specified hypothyroidism: Secondary | ICD-10-CM

## 2015-12-04 ENCOUNTER — Other Ambulatory Visit: Payer: Self-pay | Admitting: Family Medicine

## 2015-12-04 DIAGNOSIS — G43809 Other migraine, not intractable, without status migrainosus: Secondary | ICD-10-CM

## 2015-12-04 DIAGNOSIS — F32A Depression, unspecified: Secondary | ICD-10-CM

## 2015-12-04 DIAGNOSIS — F329 Major depressive disorder, single episode, unspecified: Secondary | ICD-10-CM

## 2015-12-04 DIAGNOSIS — F419 Anxiety disorder, unspecified: Secondary | ICD-10-CM

## 2015-12-05 DIAGNOSIS — M25511 Pain in right shoulder: Secondary | ICD-10-CM | POA: Diagnosis not present

## 2015-12-05 DIAGNOSIS — M542 Cervicalgia: Secondary | ICD-10-CM | POA: Diagnosis not present

## 2015-12-05 DIAGNOSIS — M25512 Pain in left shoulder: Secondary | ICD-10-CM | POA: Diagnosis not present

## 2015-12-08 DIAGNOSIS — M25511 Pain in right shoulder: Secondary | ICD-10-CM | POA: Diagnosis not present

## 2015-12-08 DIAGNOSIS — M542 Cervicalgia: Secondary | ICD-10-CM | POA: Diagnosis not present

## 2015-12-08 DIAGNOSIS — M25512 Pain in left shoulder: Secondary | ICD-10-CM | POA: Diagnosis not present

## 2015-12-12 DIAGNOSIS — M25511 Pain in right shoulder: Secondary | ICD-10-CM | POA: Diagnosis not present

## 2015-12-12 DIAGNOSIS — M542 Cervicalgia: Secondary | ICD-10-CM | POA: Diagnosis not present

## 2015-12-12 DIAGNOSIS — M25512 Pain in left shoulder: Secondary | ICD-10-CM | POA: Diagnosis not present

## 2015-12-15 DIAGNOSIS — M25511 Pain in right shoulder: Secondary | ICD-10-CM | POA: Diagnosis not present

## 2015-12-15 DIAGNOSIS — M25512 Pain in left shoulder: Secondary | ICD-10-CM | POA: Diagnosis not present

## 2015-12-15 DIAGNOSIS — M542 Cervicalgia: Secondary | ICD-10-CM | POA: Diagnosis not present

## 2015-12-19 DIAGNOSIS — J309 Allergic rhinitis, unspecified: Secondary | ICD-10-CM | POA: Diagnosis not present

## 2015-12-21 DIAGNOSIS — J301 Allergic rhinitis due to pollen: Secondary | ICD-10-CM | POA: Diagnosis not present

## 2015-12-22 DIAGNOSIS — M25512 Pain in left shoulder: Secondary | ICD-10-CM | POA: Diagnosis not present

## 2015-12-22 DIAGNOSIS — M542 Cervicalgia: Secondary | ICD-10-CM | POA: Diagnosis not present

## 2015-12-22 DIAGNOSIS — M25511 Pain in right shoulder: Secondary | ICD-10-CM | POA: Diagnosis not present

## 2015-12-26 DIAGNOSIS — M25511 Pain in right shoulder: Secondary | ICD-10-CM | POA: Diagnosis not present

## 2015-12-26 DIAGNOSIS — M25512 Pain in left shoulder: Secondary | ICD-10-CM | POA: Diagnosis not present

## 2015-12-26 DIAGNOSIS — M542 Cervicalgia: Secondary | ICD-10-CM | POA: Diagnosis not present

## 2015-12-29 DIAGNOSIS — M25512 Pain in left shoulder: Secondary | ICD-10-CM | POA: Diagnosis not present

## 2015-12-29 DIAGNOSIS — M25511 Pain in right shoulder: Secondary | ICD-10-CM | POA: Diagnosis not present

## 2015-12-29 DIAGNOSIS — M542 Cervicalgia: Secondary | ICD-10-CM | POA: Diagnosis not present

## 2016-01-02 DIAGNOSIS — M25512 Pain in left shoulder: Secondary | ICD-10-CM | POA: Diagnosis not present

## 2016-01-02 DIAGNOSIS — M25511 Pain in right shoulder: Secondary | ICD-10-CM | POA: Diagnosis not present

## 2016-01-02 DIAGNOSIS — M542 Cervicalgia: Secondary | ICD-10-CM | POA: Diagnosis not present

## 2016-01-05 DIAGNOSIS — M25512 Pain in left shoulder: Secondary | ICD-10-CM | POA: Diagnosis not present

## 2016-01-05 DIAGNOSIS — M25511 Pain in right shoulder: Secondary | ICD-10-CM | POA: Diagnosis not present

## 2016-01-05 DIAGNOSIS — M542 Cervicalgia: Secondary | ICD-10-CM | POA: Diagnosis not present

## 2016-04-09 NOTE — Telephone Encounter (Signed)
error 

## 2016-04-19 DIAGNOSIS — D225 Melanocytic nevi of trunk: Secondary | ICD-10-CM | POA: Diagnosis not present

## 2016-04-19 DIAGNOSIS — D229 Melanocytic nevi, unspecified: Secondary | ICD-10-CM | POA: Diagnosis not present

## 2016-04-19 DIAGNOSIS — L578 Other skin changes due to chronic exposure to nonionizing radiation: Secondary | ICD-10-CM | POA: Diagnosis not present

## 2016-04-19 DIAGNOSIS — D485 Neoplasm of uncertain behavior of skin: Secondary | ICD-10-CM | POA: Diagnosis not present

## 2016-04-19 DIAGNOSIS — Z1283 Encounter for screening for malignant neoplasm of skin: Secondary | ICD-10-CM | POA: Diagnosis not present

## 2016-04-19 DIAGNOSIS — L812 Freckles: Secondary | ICD-10-CM | POA: Diagnosis not present

## 2016-05-15 ENCOUNTER — Telehealth: Payer: Self-pay | Admitting: Family Medicine

## 2016-05-15 ENCOUNTER — Other Ambulatory Visit: Payer: Self-pay | Admitting: Physician Assistant

## 2016-05-15 ENCOUNTER — Encounter: Payer: Self-pay | Admitting: Physician Assistant

## 2016-05-15 ENCOUNTER — Ambulatory Visit (INDEPENDENT_AMBULATORY_CARE_PROVIDER_SITE_OTHER): Payer: BLUE CROSS/BLUE SHIELD | Admitting: Physician Assistant

## 2016-05-15 VITALS — BP 110/60 | HR 84 | Temp 97.5°F | Resp 16 | Wt 120.6 lb

## 2016-05-15 DIAGNOSIS — F419 Anxiety disorder, unspecified: Secondary | ICD-10-CM

## 2016-05-15 DIAGNOSIS — B373 Candidiasis of vulva and vagina: Secondary | ICD-10-CM | POA: Diagnosis not present

## 2016-05-15 DIAGNOSIS — B3731 Acute candidiasis of vulva and vagina: Secondary | ICD-10-CM

## 2016-05-15 DIAGNOSIS — E038 Other specified hypothyroidism: Secondary | ICD-10-CM

## 2016-05-15 DIAGNOSIS — E039 Hypothyroidism, unspecified: Secondary | ICD-10-CM

## 2016-05-15 MED ORDER — KETOCONAZOLE 200 MG PO TABS: 400.0000 mg | ORAL_TABLET | Freq: Every day | ORAL | 0 refills | Status: DC

## 2016-05-15 MED ORDER — ALPRAZOLAM 0.5 MG PO TABS: 0.5000 mg | ORAL_TABLET | Freq: Two times a day (BID) | ORAL | 5 refills | Status: DC | PRN

## 2016-05-15 NOTE — Telephone Encounter (Signed)
Spoke with pharmacy regarding the medications. Medications will be on Hold. Advised that Anderson Malta was not in the office this afternoon.  Jenni please see me.  Thanks,  -Joseline

## 2016-05-15 NOTE — Progress Notes (Signed)
[name]    DOB: [birth year]   45 y.o.   MRN: [id number] Visit Date: 05/15/2016  Today's Provider: Mar Daring, PA-C   Chief Complaint  Patient presents with  . Vaginitis   Subjective:    HPI Patient is here c/o chronic vaginal yeast infection. She reports that she wants oral medication for this. She Nguyen Dr Venia Minks had prescribed something years ago orally but cannot remember the name. She has used Diflucan at home. She Nguyen that she did take a course of 3 pills consecutively without relief of symptoms. She does have different autoimmune issues and is on antibiotics often due to pouchitis secondary complication from her ulcerative colitis. It is felt that this is what leads to her over development of yeast. She also Nguyen she changed her diet to Vegan since July.    Allergies  Allergen Reactions  . Bentyl  [Dicyclomine]   . Meperidine   . Metronidazole   . Povidone Iodine   . Sulfa Antibiotics      Current Outpatient Prescriptions:  .  ACETAMINOPHEN ER PO, , Disp: , Rfl:  .  albuterol (VENTOLIN HFA) 108 (90 BASE) MCG/ACT inhaler, Inhale 2 puffs into the lungs every 6 (six) hours as needed for wheezing or shortness of breath., Disp: 1 Inhaler, Rfl: 2 .  ALPRAZolam (XANAX) 0.5 MG tablet, Take by mouth., Disp: , Rfl:  .  beclomethasone (QVAR) 40 MCG/ACT inhaler, Inhale 2 puffs into the lungs 2 (two) times daily as needed., Disp: 1 Inhaler, Rfl: 2 .  busPIRone (BUSPAR) 15 MG tablet, Take by mouth. Reported on 09/27/2015, Disp: , Rfl:  .  cetirizine (ZYRTEC ALLERGY) 10 MG tablet, Take by mouth., Disp: , Rfl:  .  ciprofloxacin (CIPRO) 500 MG tablet, Take 500 mg by mouth 1 day or 1 dose., Disp: , Rfl:  .  Cranberry Fruit 405 MG CAPS, Take by mouth., Disp: , Rfl:  .  cyclobenzaprine (FLEXERIL) 10 MG tablet, Take by mouth., Disp: , Rfl:  .  cycloSPORINE (RESTASIS) 0.05 % ophthalmic emulsion, Apply to eye., Disp: , Rfl:  .  diphenoxylate-atropine  (LOMOTIL) 2.5-0.025 MG tablet, Take by mouth., Disp: , Rfl:  .  EPINEPHrine (EPIPEN 2-PAK) 0.3 mg/0.3 mL IJ SOAJ injection, EPIPEN 2-PAK, 0.3MG /0.3ML (Injection Solution Auto-injector)  1 (one) Soln Auto-inj as directed for 0 days  Quantity: 1;  Refills: 0   Ordered :13-Jan-2015  Margarita Rana MD;  Started 13-Jan-2015 Active Comments: Needs ov scheduled. Thanks- Dr. Jerilynn Mages., Disp: , Rfl:  .  levothyroxine (SYNTHROID, LEVOTHROID) 25 MCG tablet, TAKE 1 TABLET (25 MCG TOTAL) BY MOUTH DAILY BEFORE BREAKFAST., Disp: 30 tablet, Rfl: 6 .  sertraline (ZOLOFT) 25 MG tablet, TAKE 1 TABLET BY MOUTH 3 TIMES A DAY, Disp: 270 tablet, Rfl: 1 .  traZODone (DESYREL) 100 MG tablet, TAKE 2 TABLETS BY MOUTH AT BEDTIME, Disp: 60 tablet, Rfl: 5 .  Vitamin D, Ergocalciferol, (DRISDOL) 50000 units CAPS capsule, , Disp: , Rfl: 2 .  Adalimumab (HUMIRA [place]), Inject into the skin. Reported on 11/01/2015, Disp: , Rfl:  .  metroNIDAZOLE (FLAGYL) 500 MG tablet, Take by mouth. Reported on 09/27/2015, Disp: , Rfl:  .  nystatin (MYCOSTATIN) 100000 UNIT/ML suspension, Take 5 mLs (500,000 Units total) by mouth 2 (two) times daily., Disp: 60 mL, Rfl: 0 .  nystatin (MYCOSTATIN) powder, Apply topically 2 (two) times daily., Disp: 15 g, Rfl: 0 .  promethazine (PHENERGAN) 25 MG tablet, TAKE 1 TABLET BY  MOUTH EVERY 4 HOURS AS NEEDED (Patient not taking: Reported on 05/15/2016), Disp: 30 tablet, Rfl: 0  Review of Systems  Constitutional: Negative.   Respiratory: Negative.   Cardiovascular: Negative.   Gastrointestinal: Negative.   Genitourinary: Positive for dyspareunia and vaginal discharge. Negative for dysuria, flank pain, genital sores, hematuria, menstrual problem, pelvic pain, urgency and vaginal pain (itching).    Social History  Substance Use Topics  . Smoking status: Never Smoker  . Smokeless tobacco: Never Used  . Alcohol use 0.0 oz/week     Comment: Occasionally   Objective:   BP 110/60 (BP Location: Left Arm, Patient Position:  Sitting, Cuff Size: Normal)   Pulse 84   Temp 97.5 F (36.4 C) (Oral)   Resp 16   Wt 120 lb 9.6 oz (54.7 kg)   BMI 21.03 kg/m   Physical Exam  Constitutional: She appears well-developed and well-nourished. No distress.  Cardiovascular: Normal rate, regular rhythm and normal heart sounds.  Exam reveals no gallop and no friction rub.   No murmur heard. Pulmonary/Chest: Effort normal and breath sounds normal. No respiratory distress. She has no wheezes. She has no rales.  Skin: She is not diaphoretic.  Psychiatric: She has a normal mood and affect. Her behavior is normal. Judgment and thought content normal.  Vitals reviewed.     Assessment & Plan:     1. Vaginal yeast infection Will try a longer dose of antifungal treatment being that she has failed Diflucan. I will give her a 14 day dose of ketoconazole as below. If this works we may consider a suppressive therapy for her to use ketoconazole either once a month or directly after and a course of Cipro. She will be returning in the next few weeks for her physical exam. We will be doing her Pap at that time. She is to call the office if symptoms fail to improve. - ketoconazole (NIZORAL) 200 MG tablet; Take 2 tablets (400 mg total) by mouth daily.  Dispense: 28 tablet; Refill: 0  2. Acute anxiety Stable. Diagnosis pulled for medication refill. Continue current medical treatment plan. She is to hold off on taking the Xanax at this time until she has completed the ketoconazole treatment. - ALPRAZolam (XANAX) 0.5 MG tablet; Take 1 tablet (0.5 mg total) by mouth 2 (two) times daily as needed for anxiety.  Dispense: 60 tablet; Refill: [place], PA-C  [facility]

## 2016-05-15 NOTE — Patient Instructions (Signed)
Ketoconazole tablets What is this medicine? KETOCONAZOLE (kee toe KON na zole) is an antifungal medicine. It is used to treat certain kinds of fungal infections. This medicine may be used for other purposes; ask your health care provider or pharmacist if you have questions. What should I tell my health care provider before I take this medicine? They need to know if you have any of these conditions: -adrenal problems -an alcohol abuse problem -history of irregular heartbeat -low stomach acid production -liver disease -an unusual or allergic reaction to ketoconazole, itraconazole, miconazole, other medicines, foods, dyes or preservatives -pregnant or trying to get pregnant -breast-feeding How should I use this medicine? Take this medicine by mouth with a full glass of water. Follow the directions on the prescription label. This medicine works best if you take it with food. Take your medicine at regular intervals. Do not take your medicine more often than directed. Do not stop taking except on your doctor's advice. A special MedGuide will be given to you by the pharmacist with each prescription and refill. Be sure to read this information carefully each time. Talk to your pediatrician regarding the use of this medicine in children. Special care may be needed. Overdosage: If you think you have taken too much of this medicine contact a poison control center or emergency room at once. NOTE: This medicine is only for you. Do not share this medicine with others. What if I miss a dose? If you miss a dose, take it as soon as you can. If it is almost time for your next dose, take only that dose. Do not take double or extra doses. What may interact with this medicine? Do not take this medicine with any of the following medications: -alfuzosin -certain medicines for anxiety or sleep like alprazolam, midazolam, triazolam -certain medicines for blood pressure like felodipine, nisoldipine,  eplerenone -certain medicines for cancer like irinotecan, ibrutinib -certain medicines for cholesterol like cerivastatin, lovastatin, simvastatin, lomitapide -certain medicines for irregular heart rate like disopyramide, dofetilide, dronedarone, quinidine -cisapride -colchicine -conivaptan -ergot alkaloids like dihydroergotamine, ergonovine, ergotamine, methylergonovine -lurasidone -methadone -nevirapine -other medicines that prolong the QT interval (cause an abnormal heart rhythm) -pimozide -ranolazine -red yeast rice -sirolimus -thioridazine -tolvaptan -ziprasidone This medicine may also interact with the following medications: -alcohol or any product that contains alcohol -aliskiren -amlodipine -antacids -antiviral medicines for HIV or AIDS -aprepitant -atorvastatin -bosentan -buprenorphine -certain medicine for bladder problems like fesoterodine, solifenacin, tolterodine -certain medicines for cancer like bortezomib, busulfan, dasatinib, docetaxel, erlotinib, imatinib, ixabepilone, lapatinib, nilotinib, paclitaxel, trimetrexate, vinca alkaloids -certain medicines for depression, anxiety, or psychotic disturbances like aripiprazole, buspirone, haloperidol, quetiapine, risperidone -certain medicines for erectile dysfunction like vardenafil, sildenafil, tadalafil -certain medicines for pain like alfentanil, fentanyl, oxycodone, sufentanil -certain medicines for stomach problems like cimetidine, famotidine, omeprazole, lansoprazole -certain medicines that treat or prevent blood clots like dabigatran, rivaroxaban, warfarin -certain medicines for seizures like carbamazepine, phenytoin -certain medicines for tuberculosis like isoniazid, INH, rifabutin, rifampin, rifapentine -cilostazol -cinacalcet -cyclosporine -digoxin -eletriptan -isradipine -nadolol -nifedipine -other medicines for fungal  infections -praziquantel -ramelteon -repaglinide -salmeterol -saxagliptin -steroid medicines like budesonide, ciclesonide, dexamethasone, methylprednisolone -tacrolimus -tamsulosin -telithromycin -verapamil This list may not describe all possible interactions. Give your health care provider a list of all the medicines, herbs, non-prescription drugs, or dietary supplements you use. Also tell them if you smoke, drink alcohol, or use illegal drugs. Some items may interact with your medicine. What should I watch for while using this medicine? Visit your doctor or health care professional for  check ups. Tell your doctor if your symptoms do not improve. Some fungal infections can take many weeks or months of treatment to cure. Avoid medicines for your stomach like antacids and acid blockers for at least two hours after taking this medicine. You may get drowsy or dizzy. Do not drive, use machinery, or do anything that needs mental alertness until you know how this medicine affects you. Do not stand or sit up quickly, especially if you are an older patient. This reduces the risk of dizzy or fainting spells. Avoid alcohol while you are taking this medicine. Alcohol can increase the risk of liver damage. If you are going to have surgery, let your doctor know that you have been taking this medicine. What side effects may I notice from receiving this medicine? Side effects that you should report to your doctor or health care professional as soon as possible: -allergic reactions like skin rash, itching or hives, swelling of the face, lips, or tongue -dark urine -feeling dizzy, faint -fever -irregular heartbeat, chest pain -light color stools -loss of appetite -usually tired or weak -yellowing of the eyes or skin Side effects that usually do not require medical attention (report to your doctor or health care professional if they continue or are bothersome): -breast swelling and tenderness -change in  sex drive or performance -eyes more sensitive to light -headache -nausea, vomiting -stomach pain This list may not describe all possible side effects. Call your doctor for medical advice about side effects. You may report side effects to FDA at 1-800-FDA-1088. Where should I keep my medicine? Keep out of the reach of children. Store at room temperature between 15 and 25 degrees C (59 and 77 degrees F). Keep container tightly closed. Throw away any unused medicine after the expiration date. NOTE: This sheet is a summary. It may not cover all possible information. If you have questions about this medicine, talk to your doctor, pharmacist, or health care provider.    2016, [employer] Standard. (2013-03-02 16:59:46)

## 2016-05-15 NOTE — Telephone Encounter (Signed)
Margarita Grizzle at [facility] called regarding a drug interaction on a prescription that was called in earlier today..  Their call back is [phone]  Please advise  Thank sTeri

## 2016-05-16 NOTE — Telephone Encounter (Signed)
She is to hold xanax until ketoconazole is completed

## 2016-05-16 NOTE — Telephone Encounter (Signed)
Spoke with the pharmacist and advised as directed below and left detailed message for patient. If any question or concerns to please call back.  Thanks,  -Joseline

## 2016-05-19 ENCOUNTER — Other Ambulatory Visit: Payer: Self-pay | Admitting: Family Medicine

## 2016-05-19 DIAGNOSIS — G4700 Insomnia, unspecified: Secondary | ICD-10-CM

## 2016-05-25 DIAGNOSIS — K91850 Pouchitis: Secondary | ICD-10-CM | POA: Diagnosis not present

## 2016-05-25 DIAGNOSIS — Z9049 Acquired absence of other specified parts of digestive tract: Secondary | ICD-10-CM | POA: Diagnosis not present

## 2016-05-25 DIAGNOSIS — K529 Noninfective gastroenteritis and colitis, unspecified: Secondary | ICD-10-CM | POA: Diagnosis not present

## 2016-05-25 DIAGNOSIS — F419 Anxiety disorder, unspecified: Secondary | ICD-10-CM | POA: Diagnosis not present

## 2016-06-04 ENCOUNTER — Telehealth: Payer: Self-pay

## 2016-06-04 NOTE — Telephone Encounter (Signed)
Refill request for 90 days supply.  Prescription was prescribed 05/21/16 Qty:60 R:5

## 2016-06-04 NOTE — Telephone Encounter (Signed)
For her trazodone?

## 2016-06-04 NOTE — Telephone Encounter (Signed)
Sorry. Yes. :)

## 2016-06-05 ENCOUNTER — Ambulatory Visit (INDEPENDENT_AMBULATORY_CARE_PROVIDER_SITE_OTHER): Payer: BLUE CROSS/BLUE SHIELD | Admitting: Physician Assistant

## 2016-06-05 ENCOUNTER — Encounter: Payer: Self-pay | Admitting: Physician Assistant

## 2016-06-05 VITALS — BP 110/70 | HR 87 | Resp 16 | Wt 120.8 lb

## 2016-06-05 DIAGNOSIS — E46 Unspecified protein-calorie malnutrition: Secondary | ICD-10-CM

## 2016-06-05 DIAGNOSIS — Z23 Encounter for immunization: Secondary | ICD-10-CM

## 2016-06-05 DIAGNOSIS — B3731 Acute candidiasis of vulva and vagina: Secondary | ICD-10-CM

## 2016-06-05 DIAGNOSIS — Z789 Other specified health status: Secondary | ICD-10-CM

## 2016-06-05 DIAGNOSIS — Z13220 Encounter for screening for lipoid disorders: Secondary | ICD-10-CM | POA: Diagnosis not present

## 2016-06-05 DIAGNOSIS — Z136 Encounter for screening for cardiovascular disorders: Secondary | ICD-10-CM

## 2016-06-05 DIAGNOSIS — E038 Other specified hypothyroidism: Secondary | ICD-10-CM

## 2016-06-05 DIAGNOSIS — E039 Hypothyroidism, unspecified: Secondary | ICD-10-CM

## 2016-06-05 DIAGNOSIS — B373 Candidiasis of vulva and vagina: Secondary | ICD-10-CM

## 2016-06-05 DIAGNOSIS — E559 Vitamin D deficiency, unspecified: Secondary | ICD-10-CM | POA: Diagnosis not present

## 2016-06-05 MED ORDER — TERCONAZOLE 0.8 % VA CREA: TOPICAL_CREAM | VAGINAL | 3 refills | Status: DC

## 2016-06-05 NOTE — Telephone Encounter (Signed)
Patient is coming in today for an office visit.  Thanks, -Mariah Nguyen

## 2016-06-05 NOTE — Progress Notes (Signed)
[name]    DOB: 07-03-1971   45 y.o.   MRN: [id number] Visit Date: 06/05/2016  Today's Provider: Mar Daring, PA-C   Chief Complaint  Patient presents with  . Follow-up    Thyroid   Subjective:    HPI Subclinical Hypothyroidism: Patient presents today 6 months follow-up. Symptoms consist of denies fatigue (feels is the normal),weight changes, heat/cold intolerance, bowel/skin changes or CVS symptoms.The problem has been stable.  She has also been on a full vegan diet for a few months, with exception of fish occasionally. She reports her energy level is still low. She would like to check labs to make sure she is getting enough protein and vitamins through her new diet.    Allergies  Allergen Reactions  . Bentyl  [Dicyclomine]   . Meperidine   . Metronidazole   . Povidone Iodine   . Sulfa Antibiotics      Current Outpatient Prescriptions:  .  ACETAMINOPHEN ER PO, , Disp: , Rfl:  .  albuterol (VENTOLIN HFA) 108 (90 BASE) MCG/ACT inhaler, Inhale 2 puffs into the lungs every 6 (six) hours as needed for wheezing or shortness of breath., Disp: 1 Inhaler, Rfl: 2 .  beclomethasone (QVAR) 40 MCG/ACT inhaler, Inhale 2 puffs into the lungs 2 (two) times daily as needed., Disp: 1 Inhaler, Rfl: 2 .  busPIRone (BUSPAR) 15 MG tablet, Take by mouth. Reported on 09/27/2015, Disp: , Rfl:  .  cetirizine (ZYRTEC ALLERGY) 10 MG tablet, Take by mouth., Disp: , Rfl:  .  Cranberry Fruit 405 MG CAPS, Take by mouth., Disp: , Rfl:  .  cyclobenzaprine (FLEXERIL) 10 MG tablet, Take by mouth., Disp: , Rfl:  .  cycloSPORINE (RESTASIS) 0.05 % ophthalmic emulsion, Apply to eye., Disp: , Rfl:  .  diphenoxylate-atropine (LOMOTIL) 2.5-0.025 MG tablet, Take by mouth., Disp: , Rfl:  .  EPINEPHrine (EPIPEN 2-PAK) 0.3 mg/0.3 mL IJ SOAJ injection, EPIPEN 2-PAK, 0.3MG /0.3ML (Injection Solution Auto-injector)  1 (one) Soln Auto-inj as directed for 0 days  Quantity: 1;  Refills: 0    Ordered :13-Jan-2015  Margarita Rana MD;  Started 13-Jan-2015 Active Comments: Needs ov scheduled. Thanks- Dr. Jerilynn Mages., Disp: , Rfl:  .  levothyroxine (SYNTHROID, LEVOTHROID) 25 MCG tablet, TAKE 1 TABLET (25 MCG TOTAL) BY MOUTH DAILY BEFORE BREAKFAST., Disp: 30 tablet, Rfl: 1 .  nystatin (MYCOSTATIN) powder, Apply topically 2 (two) times daily., Disp: 15 g, Rfl: 0 .  sertraline (ZOLOFT) 25 MG tablet, TAKE 1 TABLET BY MOUTH 3 TIMES A DAY, Disp: 270 tablet, Rfl: 1 .  traZODone (DESYREL) 100 MG tablet, TAKE 2 TABLETS BY MOUTH AT BEDTIME, Disp: 60 tablet, Rfl: 5 .  Vitamin D, Ergocalciferol, (DRISDOL) 50000 units CAPS capsule, , Disp: , Rfl: 2 .  ALPRAZolam (XANAX) 0.5 MG tablet, Take 1 tablet (0.5 mg total) by mouth 2 (two) times daily as needed for anxiety. (Patient not taking: Reported on 06/05/2016), Disp: 60 tablet, Rfl: 5 .  ciprofloxacin (CIPRO) 500 MG tablet, Take 500 mg by mouth 1 day or 1 dose., Disp: , Rfl:  .  ketoconazole (NIZORAL) 200 MG tablet, Take 2 tablets (400 mg total) by mouth daily. (Patient not taking: Reported on 06/05/2016), Disp: 28 tablet, Rfl: 0 .  metroNIDAZOLE (FLAGYL) 500 MG tablet, Take by mouth. Reported on 09/27/2015, Disp: , Rfl:  .  promethazine (PHENERGAN) 25 MG tablet, TAKE 1 TABLET BY MOUTH EVERY 4 HOURS AS NEEDED (Patient not taking: Reported on 06/05/2016),  Disp: 30 tablet, Rfl: 0  Review of Systems  Constitutional: Positive for fatigue.  Cardiovascular: Negative for chest pain, palpitations and leg swelling.    Social History  Substance Use Topics  . Smoking status: Never Smoker  . Smokeless tobacco: Never Used  . Alcohol use 0.0 oz/week     Comment: Occasionally   Objective:   BP 110/70 (BP Location: Left Arm, Patient Position: Sitting, Cuff Size: Normal)   Pulse 87   Resp 16   Wt 120 lb 12.8 oz (54.8 kg)   BMI 21.06 kg/m   Physical Exam  Constitutional: She appears well-developed and well-nourished. No distress.  HENT:  Head: Normocephalic and  atraumatic.  Right Ear: Hearing, tympanic membrane, external ear and ear canal normal.  Left Ear: Hearing, tympanic membrane, external ear and ear canal normal.  Nose: Nose normal.  Mouth/Throat: Uvula is midline, oropharynx is clear and moist and mucous membranes are normal. No oropharyngeal exudate.  Neck: Normal range of motion. Neck supple. No tracheal deviation present. No thyromegaly present.  Cardiovascular: Normal rate, regular rhythm and normal heart sounds.  Exam reveals no gallop and no friction rub.   No murmur heard. Pulmonary/Chest: Effort normal and breath sounds normal. No respiratory distress. She has no wheezes. She has no rales.  Lymphadenopathy:    She has no cervical adenopathy.  Skin: She is not diaphoretic.  Vitals reviewed.     Assessment & Plan:     1. Subclinical hypothyroidism Stable. Continue levothyroxine 16mcg for now. Will check labs as below and f/u pending results. - Thyroid Panel With TSH  2. Malnutrition, unspecified type ([place]) Will check labs as below and f/u pending results. - CBC with Differential - Comprehensive Metabolic Panel (CMET) - Vitamin D 1,25 dihydroxy - B12 - Iron Binding Cap (TIBC)  3. Vegan diet Will check labs as below and f/u pending results. - CBC with Differential - Comprehensive Metabolic Panel (CMET) - Vitamin D 1,25 dihydroxy - B12 - Iron Binding Cap (TIBC)  4. Vitamin D deficiency H/O this. Currently on Vit D 50,000IU weekly. Will check labs as below and f/u pending results. - Vitamin D 1,25 dihydroxy  5. Yeast infection involving the vagina and surrounding area She completed the 14 day ketoconazole treatment successfully. Will now start terazol once weekly for suppression.  - terconazole (TERAZOL 3) 0.8 % vaginal cream; Insert vaginally once weekly  Dispense: 20 g; Refill: 3  6. Need for influenza vaccination Flu vaccine given today without complication. Patient sat upright for 15 minutes to check for adverse  reaction before being released. - Flu Vaccine QUAD 36+ mos PF IM (Fluarix & Fluzone Quad PF)  7. Encounter for lipid screening for cardiovascular disease Will check labs as below and f/u pending results. - Lipid Profile       Mar Daring, PA-C  [facility]

## 2016-06-05 NOTE — Patient Instructions (Addendum)
Menopause is a normal process in which your reproductive ability comes to an end. This process happens gradually over a span of months to years, usually between the ages of 48 and 55. Menopause is complete when you have missed 12 consecutive menstrual periods. It is important to talk with your health care provider about some of the most common conditions that affect postmenopausal women, such as heart disease, cancer, and bone loss (osteoporosis). Adopting a healthy lifestyle and getting preventive care can help to promote your health and wellness. Those actions can also lower your chances of developing some of these common conditions. WHAT SHOULD I KNOW ABOUT MENOPAUSE? During menopause, you may experience a number of symptoms, such as:  Moderate-to-severe hot flashes.  Night sweats.  Decrease in sex drive.  Mood swings.  Headaches.  Tiredness.  Irritability.  Memory problems.  Insomnia. Choosing to treat or not to treat menopausal changes is an individual decision that you make with your health care provider. WHAT SHOULD I KNOW ABOUT HORMONE REPLACEMENT THERAPY AND SUPPLEMENTS? Hormone therapy products are effective for treating symptoms that are associated with menopause, such as hot flashes and night sweats. Hormone replacement carries certain risks, especially as you become older. If you are thinking about using estrogen or estrogen with progestin treatments, discuss the benefits and risks with your health care provider. WHAT SHOULD I KNOW ABOUT HEART DISEASE AND STROKE? Heart disease, heart attack, and stroke become more likely as you age. This may be due, in part, to the hormonal changes that your body experiences during menopause. These can affect how your body processes dietary fats, triglycerides, and cholesterol. Heart attack and stroke are both medical emergencies. There are many things that you can do to help prevent heart disease and stroke:  Have your blood pressure  checked at least every 1-2 years. High blood pressure causes heart disease and increases the risk of stroke.  If you are 55-79 years old, ask your health care provider if you should take aspirin to prevent a heart attack or a stroke.  Do not use any tobacco products, including cigarettes, chewing tobacco, or electronic cigarettes. If you need help quitting, ask your health care provider.  It is important to eat a healthy diet and maintain a healthy weight.  Be sure to include plenty of vegetables, fruits, low-fat dairy products, and lean protein.  Avoid eating foods that are high in solid fats, added sugars, or salt (sodium).  Get regular exercise. This is one of the most important things that you can do for your health.  Try to exercise for at least 150 minutes each week. The type of exercise that you do should increase your heart rate and make you sweat. This is known as moderate-intensity exercise.  Try to do strengthening exercises at least twice each week. Do these in addition to the moderate-intensity exercise.  Know your numbers.Ask your health care provider to check your cholesterol and your blood glucose. Continue to have your blood tested as directed by your health care provider. WHAT SHOULD I KNOW ABOUT CANCER SCREENING? There are several types of cancer. Take the following steps to reduce your risk and to catch any cancer development as early as possible. Breast Cancer  Practice breast self-awareness.  This means understanding how your breasts normally appear and feel.  It also means doing regular breast self-exams. Let your health care provider know about any changes, no matter how small.  If you are 40 or older, have a clinician do a   breast exam (clinical breast exam or CBE) every year. Depending on your age, family history, and medical history, it may be recommended that you also have a yearly breast X-ray (mammogram).  If you have a family history of breast cancer,  talk with your health care provider about genetic screening.  If you are at high risk for breast cancer, talk with your health care provider about having an MRI and a mammogram every year.  Breast cancer (BRCA) gene test is recommended for women who have family members with BRCA-related cancers. Results of the assessment will determine the need for genetic counseling and BRCA1 and for BRCA2 testing. BRCA-related cancers include these types:  Breast. This occurs in males or females.  Ovarian.  Tubal. This may also be called fallopian tube cancer.  Cancer of the abdominal or pelvic lining (peritoneal cancer).  Prostate.  Pancreatic. Cervical, Uterine, and Ovarian Cancer Your health care provider may recommend that you be screened regularly for cancer of the pelvic organs. These include your ovaries, uterus, and vagina. This screening involves a pelvic exam, which includes checking for microscopic changes to the surface of your cervix (Pap test).  For women ages 21-65, health care providers may recommend a pelvic exam and a Pap test every three years. For women ages 77-65, they may recommend the Pap test and pelvic exam, combined with testing for human papilloma virus (HPV), every five years. Some types of HPV increase your risk of cervical cancer. Testing for HPV may also be done on women of any age who have unclear Pap test results.  Other health care providers may not recommend any screening for nonpregnant women who are considered low risk for pelvic cancer and have no symptoms. Ask your health care provider if a screening pelvic exam is right for you.  If you have had past treatment for cervical cancer or a condition that could lead to cancer, you need Pap tests and screening for cancer for at least 20 years after your treatment. If Pap tests have been discontinued for you, your risk factors (such as having a new sexual partner) need to be reassessed to determine if you should start having  screenings again. Some women have medical problems that increase the chance of getting cervical cancer. In these cases, your health care provider may recommend that you have screening and Pap tests more often.  If you have a family history of uterine cancer or ovarian cancer, talk with your health care provider about genetic screening.  If you have vaginal bleeding after reaching menopause, tell your health care provider.  There are currently no reliable tests available to screen for ovarian cancer. Lung Cancer Lung cancer screening is recommended for adults 3-70 years old who are at high risk for lung cancer because of a history of smoking. A yearly low-dose CT scan of the lungs is recommended if you:  Currently smoke.  Have a history of at least 30 pack-years of smoking and you currently smoke or have quit within the past 15 years. A pack-year is smoking an average of one pack of cigarettes per day for one year. Yearly screening should:  Continue until it has been 15 years since you quit.  Stop if you develop a health problem that would prevent you from having lung cancer treatment. Colorectal Cancer  This type of cancer can be detected and can often be prevented.  Routine colorectal cancer screening usually begins at age 38 and continues through age 12.  If you have  risk factors for colon cancer, your health care provider may recommend that you be screened at an earlier age.  If you have a family history of colorectal cancer, talk with your health care provider about genetic screening.  Your health care provider may also recommend using home test kits to check for hidden blood in your stool.  A small camera at the end of a tube can be used to examine your colon directly (sigmoidoscopy or colonoscopy). This is done to check for the earliest forms of colorectal cancer.  Direct examination of the colon should be repeated every 5-10 years until age 67. However, if early forms of  precancerous polyps or small growths are found or if you have a family history or genetic risk for colorectal cancer, you may need to be screened more often. Skin Cancer  Check your skin from head to toe regularly.  Monitor any moles. Be sure to tell your health care provider:  About any new moles or changes in moles, especially if there is a change in a mole's shape or color.  If you have a mole that is larger than the size of a pencil eraser.  If any of your family members has a history of skin cancer, especially at a young age, talk with your health care provider about genetic screening.  Always use sunscreen. Apply sunscreen liberally and repeatedly throughout the day.  Whenever you are outside, protect yourself by wearing long sleeves, pants, a wide-brimmed hat, and sunglasses. WHAT SHOULD I KNOW ABOUT OSTEOPOROSIS? Osteoporosis is a condition in which bone destruction happens more quickly than new bone creation. After menopause, you may be at an increased risk for osteoporosis. To help prevent osteoporosis or the bone fractures that can happen because of osteoporosis, the following is recommended:  If you are 39-61 years old, get at least 1,000 mg of calcium and at least 600 mg of vitamin D per day.  If you are older than age 16 but younger than age 7, get at least 1,200 mg of calcium and at least 600 mg of vitamin D per day.  If you are older than age 47, get at least 1,200 mg of calcium and at least 800 mg of vitamin D per day. Smoking and excessive alcohol intake increase the risk of osteoporosis. Eat foods that are rich in calcium and vitamin D, and do weight-bearing exercises several times each week as directed by your health care provider. WHAT SHOULD I KNOW ABOUT HOW MENOPAUSE AFFECTS Mariah Nguyen? Depression may occur at any age, but it is more common as you become older. Common symptoms of depression include:  Low or sad mood.  Changes in sleep patterns.  Changes  in appetite or eating patterns.  Feeling an overall lack of motivation or enjoyment of activities that you previously enjoyed.  Frequent crying spells. Talk with your health care provider if you think that you are experiencing depression. WHAT SHOULD I KNOW ABOUT IMMUNIZATIONS? It is important that you get and maintain your immunizations. These include:  Tetanus, diphtheria, and pertussis (Tdap) booster vaccine.  Influenza every year before the flu season begins.  Pneumonia vaccine.  Shingles vaccine. Your health care provider may also recommend other immunizations.   This information is not intended to replace advice given to you by your health care provider. Make sure you discuss any questions you have with your health care provider.   Document Released: 10/12/2005 Document Revised: 09/10/2014 Document Reviewed: 04/22/2014 Elsevier Interactive Patient Education 2016 [employer] Hypothyroidism Hypothyroidism is a disorder of the thyroid. The thyroid is a large gland that is located in the lower front of the neck. The thyroid releases hormones that control how the body works. With hypothyroidism, the thyroid does not make enough of these hormones. CAUSES Causes of hypothyroidism may include:  Viral infections.  Pregnancy.  Your own defense system (immune system) attacking your thyroid.  Certain medicines.  Birth defects.  Past radiation treatments to your head or neck.  Past treatment with radioactive iodine.  Past surgical removal of part or all of your thyroid.  Problems with the gland that is located in the center of your brain (pituitary). SIGNS AND SYMPTOMS Signs and symptoms of hypothyroidism may include:  Feeling as though you have no energy (lethargy).  Inability to tolerate cold.  Weight gain that is not explained by a change in diet or exercise habits.  Dry skin.  Coarse hair.  Menstrual irregularity.  Slowing of thought  processes.  Constipation.  Sadness or depression. DIAGNOSIS  Your health care provider may diagnose hypothyroidism with blood tests and ultrasound tests. TREATMENT Hypothyroidism is treated with medicine that replaces the hormones that your body does not make. After you begin treatment, it may take several weeks for symptoms to go away. HOME CARE INSTRUCTIONS   Take medicines only as directed by your health care provider.  If you start taking any new medicines, tell your health care provider.  Keep all follow-up visits as directed by your health care provider. This is important. As your condition improves, your dosage needs may change. You will need to have blood tests regularly so that your health care provider can watch your condition. SEEK MEDICAL CARE IF:  Your symptoms do not get better with treatment.  You are taking thyroid replacement medicine and:  You sweat excessively.  You have tremors.  You feel anxious.  You lose weight rapidly.  You cannot tolerate heat.  You have emotional swings.  You have diarrhea.  You feel weak. SEEK IMMEDIATE MEDICAL CARE IF:   You develop chest pain.  You develop an irregular heartbeat.  You develop a rapid heartbeat.   This information is not intended to replace advice given to you by your health care provider. Make sure you discuss any questions you have with your health care provider.   Document Released: 08/20/2005 Document Revised: 09/10/2014 Document Reviewed: 01/05/2014 Elsevier Interactive Patient Education [employer].

## 2016-06-06 ENCOUNTER — Telehealth: Payer: Self-pay

## 2016-06-06 DIAGNOSIS — E039 Hypothyroidism, unspecified: Secondary | ICD-10-CM

## 2016-06-06 DIAGNOSIS — E038 Other specified hypothyroidism: Secondary | ICD-10-CM

## 2016-06-06 NOTE — Telephone Encounter (Signed)
-----   Message from Mar Daring, [place] sent at 06/06/2016  9:26 AM EDT ----- T3 is still low at 21, so if wanted we can try to increase levothyroxine to see if we get response. Also iron saturation is a little low and with vegan diet may benefit by adding ferrous sulfate 325mg  once daily. All other labs are stable and WNL.

## 2016-06-06 NOTE — Telephone Encounter (Signed)
Pt states she is returning call about her test. Please return pt's call. @ [phone]. Thanks CC

## 2016-06-06 NOTE — Telephone Encounter (Signed)
LMTCB

## 2016-06-07 MED ORDER — LEVOTHYROXINE SODIUM 50 MCG PO TABS: 50.0000 ug | ORAL_TABLET | Freq: Every day | ORAL | 1 refills | Status: DC

## 2016-06-07 NOTE — Telephone Encounter (Signed)
We can recheck in 3 months. Will place order and can be released when it is time to recheck. I will call her with results.

## 2016-06-07 NOTE — Telephone Encounter (Signed)
Pt advised. Jaydee Ingman Drozdowski, CMA  

## 2016-06-07 NOTE — Telephone Encounter (Signed)
Pt does agree to increase Levothyroxine, and will start taking Fe more consistently (is probably taking 4 times a week). [facility]. Is also asking when she should have thyroid panel rechecked. Please advise. Renaldo Fiddler, CMA

## 2016-06-08 ENCOUNTER — Other Ambulatory Visit: Payer: Self-pay | Admitting: Family Medicine

## 2016-06-08 DIAGNOSIS — F329 Major depressive disorder, single episode, unspecified: Secondary | ICD-10-CM

## 2016-06-08 DIAGNOSIS — F32A Depression, unspecified: Secondary | ICD-10-CM

## 2016-06-08 NOTE — Telephone Encounter (Signed)
Please review-aa 

## 2016-06-09 LAB — CBC WITH DIFFERENTIAL/PLATELET
BASOS: 1 %
Basophils Absolute: 0 10*3/uL (ref 0.0–0.2)
EOS (ABSOLUTE): 0.1 10*3/uL (ref 0.0–0.4)
Eos: 1 %
Hematocrit: 38.1 % (ref 34.0–46.6)
Hemoglobin: 12.9 g/dL (ref 11.1–15.9)
Immature Grans (Abs): 0 10*3/uL (ref 0.0–0.1)
Immature Granulocytes: 1 %
Lymphocytes Absolute: 1.4 10*3/uL (ref 0.7–3.1)
Lymphs: 18 %
MCH: 29.3 pg (ref 26.6–33.0)
MCHC: 33.9 g/dL (ref 31.5–35.7)
MCV: 87 fL (ref 79–97)
MONOS ABS: 0.5 10*3/uL (ref 0.1–0.9)
Monocytes: 7 %
NEUTROS ABS: 5.5 10*3/uL (ref 1.4–7.0)
NEUTROS PCT: 72 %
PLATELETS: 283 10*3/uL (ref 150–379)
RBC: 4.4 x10E6/uL (ref 3.77–5.28)
RDW: 15.8 % — AB (ref 12.3–15.4)
WBC: 7.6 10*3/uL (ref 3.4–10.8)

## 2016-06-09 LAB — COMPREHENSIVE METABOLIC PANEL
A/G RATIO: 1.4 (ref 1.2–2.2)
ALBUMIN: 3.9 g/dL (ref 3.5–5.5)
ALK PHOS: 87 IU/L (ref 39–117)
ALT: 13 IU/L (ref 0–32)
AST: 22 IU/L (ref 0–40)
BUN / CREAT RATIO: 12 (ref 9–23)
BUN: 12 mg/dL (ref 6–24)
Bilirubin Total: 0.2 mg/dL (ref 0.0–1.2)
CALCIUM: 8.8 mg/dL (ref 8.7–10.2)
CO2: 23 mmol/L (ref 18–29)
Chloride: 103 mmol/L (ref 96–106)
Creatinine, Ser: 0.97 mg/dL (ref 0.57–1.00)
GFR calc Af Amer: 80 mL/min/{1.73_m2} (ref >59)
GFR, EST NON AFRICAN AMERICAN: 70 mL/min/{1.73_m2} (ref >59)
GLOBULIN, TOTAL: 2.7 g/dL (ref 1.5–4.5)
GLUCOSE: 83 mg/dL (ref 65–99)
POTASSIUM: 4.2 mmol/L (ref 3.5–5.2)
SODIUM: 141 mmol/L (ref 134–144)
Total Protein: 6.6 g/dL (ref 6.0–8.5)

## 2016-06-09 LAB — LIPID PANEL
CHOL/HDL RATIO: 4.4 ratio (ref 0.0–4.4)
CHOLESTEROL TOTAL: 188 mg/dL (ref 100–199)
HDL: 43 mg/dL (ref >39)
LDL CALC: 77 mg/dL (ref 0–99)
Triglycerides: 340 mg/dL — ABNORMAL HIGH (ref 0–149)
VLDL CHOLESTEROL CAL: 68 mg/dL — AB (ref 5–40)

## 2016-06-09 LAB — IRON AND TIBC
IRON SATURATION: 11 % — AB (ref 15–55)
Iron: 37 ug/dL (ref 27–159)
Total Iron Binding Capacity: 335 ug/dL (ref 250–450)
UIBC: 298 ug/dL (ref 131–425)

## 2016-06-09 LAB — THYROID PANEL WITH TSH
Free Thyroxine Index: 1.9 (ref 1.2–4.9)
T3 Uptake Ratio: 21 % — ABNORMAL LOW (ref 24–39)
T4 TOTAL: 8.9 ug/dL (ref 4.5–12.0)
TSH: 1.4 u[IU]/mL (ref 0.450–4.500)

## 2016-06-09 LAB — VITAMIN D 1,25 DIHYDROXY
VITAMIN D2 1, 25 (OH): 35 pg/mL
Vitamin D 1, 25 (OH)2 Total: 46 pg/mL
Vitamin D3 1, 25 (OH)2: 11 pg/mL

## 2016-06-09 LAB — VITAMIN B12: Vitamin B-12: 1459 pg/mL — ABNORMAL HIGH (ref 211–946)

## 2016-10-10 DIAGNOSIS — M5414 Radiculopathy, thoracic region: Secondary | ICD-10-CM | POA: Diagnosis not present

## 2016-10-10 DIAGNOSIS — M62830 Muscle spasm of back: Secondary | ICD-10-CM | POA: Diagnosis not present

## 2016-10-10 DIAGNOSIS — M9903 Segmental and somatic dysfunction of lumbar region: Secondary | ICD-10-CM | POA: Diagnosis not present

## 2016-10-10 DIAGNOSIS — M9902 Segmental and somatic dysfunction of thoracic region: Secondary | ICD-10-CM | POA: Diagnosis not present

## 2016-10-12 DIAGNOSIS — M9902 Segmental and somatic dysfunction of thoracic region: Secondary | ICD-10-CM | POA: Diagnosis not present

## 2016-10-12 DIAGNOSIS — M9903 Segmental and somatic dysfunction of lumbar region: Secondary | ICD-10-CM | POA: Diagnosis not present

## 2016-10-12 DIAGNOSIS — M62830 Muscle spasm of back: Secondary | ICD-10-CM | POA: Diagnosis not present

## 2016-10-12 DIAGNOSIS — M5414 Radiculopathy, thoracic region: Secondary | ICD-10-CM | POA: Diagnosis not present

## 2016-10-15 DIAGNOSIS — M9902 Segmental and somatic dysfunction of thoracic region: Secondary | ICD-10-CM | POA: Diagnosis not present

## 2016-10-15 DIAGNOSIS — M9903 Segmental and somatic dysfunction of lumbar region: Secondary | ICD-10-CM | POA: Diagnosis not present

## 2016-10-15 DIAGNOSIS — M5414 Radiculopathy, thoracic region: Secondary | ICD-10-CM | POA: Diagnosis not present

## 2016-10-15 DIAGNOSIS — M62830 Muscle spasm of back: Secondary | ICD-10-CM | POA: Diagnosis not present

## 2016-10-17 DIAGNOSIS — M62830 Muscle spasm of back: Secondary | ICD-10-CM | POA: Diagnosis not present

## 2016-10-17 DIAGNOSIS — M5414 Radiculopathy, thoracic region: Secondary | ICD-10-CM | POA: Diagnosis not present

## 2016-10-17 DIAGNOSIS — M9902 Segmental and somatic dysfunction of thoracic region: Secondary | ICD-10-CM | POA: Diagnosis not present

## 2016-10-17 DIAGNOSIS — M9903 Segmental and somatic dysfunction of lumbar region: Secondary | ICD-10-CM | POA: Diagnosis not present

## 2016-10-19 DIAGNOSIS — M5414 Radiculopathy, thoracic region: Secondary | ICD-10-CM | POA: Diagnosis not present

## 2016-10-19 DIAGNOSIS — M9902 Segmental and somatic dysfunction of thoracic region: Secondary | ICD-10-CM | POA: Diagnosis not present

## 2016-10-19 DIAGNOSIS — M62830 Muscle spasm of back: Secondary | ICD-10-CM | POA: Diagnosis not present

## 2016-10-19 DIAGNOSIS — M9903 Segmental and somatic dysfunction of lumbar region: Secondary | ICD-10-CM | POA: Diagnosis not present

## 2016-10-22 DIAGNOSIS — M62830 Muscle spasm of back: Secondary | ICD-10-CM | POA: Diagnosis not present

## 2016-10-22 DIAGNOSIS — M9902 Segmental and somatic dysfunction of thoracic region: Secondary | ICD-10-CM | POA: Diagnosis not present

## 2016-10-22 DIAGNOSIS — M5414 Radiculopathy, thoracic region: Secondary | ICD-10-CM | POA: Diagnosis not present

## 2016-10-22 DIAGNOSIS — M9903 Segmental and somatic dysfunction of lumbar region: Secondary | ICD-10-CM | POA: Diagnosis not present

## 2016-10-24 ENCOUNTER — Telehealth: Payer: Self-pay | Admitting: Physician Assistant

## 2016-10-24 DIAGNOSIS — Z9049 Acquired absence of other specified parts of digestive tract: Secondary | ICD-10-CM

## 2016-10-24 DIAGNOSIS — R5382 Chronic fatigue, unspecified: Secondary | ICD-10-CM

## 2016-10-24 DIAGNOSIS — E038 Other specified hypothyroidism: Secondary | ICD-10-CM

## 2016-10-24 DIAGNOSIS — K91850 Pouchitis: Secondary | ICD-10-CM

## 2016-10-24 DIAGNOSIS — E039 Hypothyroidism, unspecified: Secondary | ICD-10-CM

## 2016-10-24 NOTE — Telephone Encounter (Signed)
Pt stated it was time for to her to have her labs rechecked. Pt stated she needs a lab slip to have her TSH, Vit D, Potassium, Iron, Protein, and anything else Mariah Nguyen would like to have ran. Pt would like a call back when lab slip is ready for pick up. Please advise. Thanks TNP

## 2016-10-24 NOTE — Telephone Encounter (Signed)
Ok to order labs? Please advise. Thanks!

## 2016-10-25 DIAGNOSIS — M62830 Muscle spasm of back: Secondary | ICD-10-CM | POA: Diagnosis not present

## 2016-10-25 DIAGNOSIS — M5414 Radiculopathy, thoracic region: Secondary | ICD-10-CM | POA: Diagnosis not present

## 2016-10-25 DIAGNOSIS — M9902 Segmental and somatic dysfunction of thoracic region: Secondary | ICD-10-CM | POA: Diagnosis not present

## 2016-10-25 DIAGNOSIS — M9903 Segmental and somatic dysfunction of lumbar region: Secondary | ICD-10-CM | POA: Diagnosis not present

## 2016-10-25 NOTE — Telephone Encounter (Signed)
Orders printed

## 2016-10-25 NOTE — Telephone Encounter (Signed)
Please order a thyroid panel with TSH, TIBC, iron, cmp, cbc, vit d. Thanks.

## 2016-10-31 DIAGNOSIS — M9902 Segmental and somatic dysfunction of thoracic region: Secondary | ICD-10-CM | POA: Diagnosis not present

## 2016-10-31 DIAGNOSIS — M62830 Muscle spasm of back: Secondary | ICD-10-CM | POA: Diagnosis not present

## 2016-10-31 DIAGNOSIS — M9903 Segmental and somatic dysfunction of lumbar region: Secondary | ICD-10-CM | POA: Diagnosis not present

## 2016-10-31 DIAGNOSIS — M5414 Radiculopathy, thoracic region: Secondary | ICD-10-CM | POA: Diagnosis not present

## 2016-11-01 ENCOUNTER — Other Ambulatory Visit: Payer: Self-pay

## 2016-11-01 DIAGNOSIS — E039 Hypothyroidism, unspecified: Secondary | ICD-10-CM | POA: Diagnosis not present

## 2016-11-01 DIAGNOSIS — Z9049 Acquired absence of other specified parts of digestive tract: Secondary | ICD-10-CM | POA: Diagnosis not present

## 2016-11-01 DIAGNOSIS — M62838 Other muscle spasm: Secondary | ICD-10-CM

## 2016-11-01 DIAGNOSIS — R5382 Chronic fatigue, unspecified: Secondary | ICD-10-CM | POA: Diagnosis not present

## 2016-11-01 DIAGNOSIS — K91850 Pouchitis: Secondary | ICD-10-CM | POA: Diagnosis not present

## 2016-11-01 MED ORDER — CYCLOBENZAPRINE HCL 10 MG PO TABS: 10.0000 mg | ORAL_TABLET | Freq: Every day | ORAL | 5 refills | Status: DC

## 2016-11-01 NOTE — Telephone Encounter (Signed)
Patient called and states she needs refill on Flexeril she uses for muscle spasms as needed. She states she received this medication originally from here. Please review-aa

## 2016-11-02 ENCOUNTER — Telehealth: Payer: Self-pay

## 2016-11-02 LAB — COMPREHENSIVE METABOLIC PANEL
ALK PHOS: 84 IU/L (ref 39–117)
ALT: 13 IU/L (ref 0–32)
AST: 18 IU/L (ref 0–40)
Albumin/Globulin Ratio: 1.5 (ref 1.2–2.2)
Albumin: 3.9 g/dL (ref 3.5–5.5)
BUN/Creatinine Ratio: 14 (ref 9–23)
BUN: 10 mg/dL (ref 6–24)
CHLORIDE: 100 mmol/L (ref 96–106)
CO2: 25 mmol/L (ref 18–29)
Calcium: 9 mg/dL (ref 8.7–10.2)
Creatinine, Ser: 0.72 mg/dL (ref 0.57–1.00)
GFR calc non Af Amer: 100 mL/min/{1.73_m2} (ref >59)
GFR, EST AFRICAN AMERICAN: 115 mL/min/{1.73_m2} (ref >59)
GLUCOSE: 92 mg/dL (ref 65–99)
Globulin, Total: 2.6 g/dL (ref 1.5–4.5)
Potassium: 3.5 mmol/L (ref 3.5–5.2)
Sodium: 140 mmol/L (ref 134–144)
TOTAL PROTEIN: 6.5 g/dL (ref 6.0–8.5)

## 2016-11-02 LAB — CBC WITH DIFFERENTIAL/PLATELET
BASOS ABS: 0 10*3/uL (ref 0.0–0.2)
Basos: 1 %
EOS (ABSOLUTE): 0.1 10*3/uL (ref 0.0–0.4)
Eos: 1 %
HEMOGLOBIN: 11.1 g/dL (ref 11.1–15.9)
Hematocrit: 34.1 % (ref 34.0–46.6)
IMMATURE GRANS (ABS): 0 10*3/uL (ref 0.0–0.1)
Immature Granulocytes: 0 %
LYMPHS: 27 %
Lymphocytes Absolute: 1.3 10*3/uL (ref 0.7–3.1)
MCH: 26.9 pg (ref 26.6–33.0)
MCHC: 32.6 g/dL (ref 31.5–35.7)
MCV: 83 fL (ref 79–97)
MONOCYTES: 11 %
Monocytes Absolute: 0.5 10*3/uL (ref 0.1–0.9)
NEUTROS ABS: 3 10*3/uL (ref 1.4–7.0)
NEUTROS PCT: 60 %
PLATELETS: 233 10*3/uL (ref 150–379)
RBC: 4.12 x10E6/uL (ref 3.77–5.28)
RDW: 14.5 % (ref 12.3–15.4)
WBC: 4.9 10*3/uL (ref 3.4–10.8)

## 2016-11-02 LAB — THYROID PANEL WITH TSH
Free Thyroxine Index: 1.7 (ref 1.2–4.9)
T3 Uptake Ratio: 21 % — ABNORMAL LOW (ref 24–39)
T4, Total: 8.1 ug/dL (ref 4.5–12.0)
TSH: 0.729 u[IU]/mL (ref 0.450–4.500)

## 2016-11-02 LAB — VITAMIN D 25 HYDROXY (VIT D DEFICIENCY, FRACTURES): VIT D 25 HYDROXY: 43 ng/mL (ref 30.0–100.0)

## 2016-11-02 LAB — IRON AND TIBC
IRON SATURATION: 13 % — AB (ref 15–55)
IRON: 56 ug/dL (ref 27–159)
Total Iron Binding Capacity: 423 ug/dL (ref 250–450)
UIBC: 367 ug/dL (ref 131–425)

## 2016-11-02 NOTE — Telephone Encounter (Signed)
-----   Message from Mar Daring, PA-C sent at 11/02/2016 10:03 AM EST ----- No I am not because her TSH is 0.729. I think she should try iron supplement if she can tolerate. I dont see one on her med list.

## 2016-11-02 NOTE — Telephone Encounter (Signed)
Patient advised as below. Patient verbalizes understanding and is in agreement with treatment plan.  

## 2016-11-07 DIAGNOSIS — M9902 Segmental and somatic dysfunction of thoracic region: Secondary | ICD-10-CM | POA: Diagnosis not present

## 2016-11-07 DIAGNOSIS — M62830 Muscle spasm of back: Secondary | ICD-10-CM | POA: Diagnosis not present

## 2016-11-07 DIAGNOSIS — M5414 Radiculopathy, thoracic region: Secondary | ICD-10-CM | POA: Diagnosis not present

## 2016-11-07 DIAGNOSIS — M9903 Segmental and somatic dysfunction of lumbar region: Secondary | ICD-10-CM | POA: Diagnosis not present

## 2016-11-13 DIAGNOSIS — M9902 Segmental and somatic dysfunction of thoracic region: Secondary | ICD-10-CM | POA: Diagnosis not present

## 2016-11-13 DIAGNOSIS — M62830 Muscle spasm of back: Secondary | ICD-10-CM | POA: Diagnosis not present

## 2016-11-13 DIAGNOSIS — M9903 Segmental and somatic dysfunction of lumbar region: Secondary | ICD-10-CM | POA: Diagnosis not present

## 2016-11-13 DIAGNOSIS — M5414 Radiculopathy, thoracic region: Secondary | ICD-10-CM | POA: Diagnosis not present

## 2016-11-19 DIAGNOSIS — Z1231 Encounter for screening mammogram for malignant neoplasm of breast: Secondary | ICD-10-CM | POA: Diagnosis not present

## 2016-11-19 DIAGNOSIS — Z803 Family history of malignant neoplasm of breast: Secondary | ICD-10-CM | POA: Diagnosis not present

## 2016-11-19 LAB — HM MAMMOGRAPHY

## 2016-11-23 DIAGNOSIS — K91850 Pouchitis: Secondary | ICD-10-CM | POA: Diagnosis not present

## 2016-11-23 DIAGNOSIS — Z9049 Acquired absence of other specified parts of digestive tract: Secondary | ICD-10-CM | POA: Diagnosis not present

## 2016-11-23 DIAGNOSIS — K529 Noninfective gastroenteritis and colitis, unspecified: Secondary | ICD-10-CM | POA: Diagnosis not present

## 2016-11-26 ENCOUNTER — Other Ambulatory Visit: Payer: Self-pay | Admitting: Family Medicine

## 2016-11-26 ENCOUNTER — Encounter: Payer: Self-pay | Admitting: Physician Assistant

## 2016-11-26 DIAGNOSIS — Z9109 Other allergy status, other than to drugs and biological substances: Secondary | ICD-10-CM

## 2016-11-26 DIAGNOSIS — M62830 Muscle spasm of back: Secondary | ICD-10-CM | POA: Diagnosis not present

## 2016-11-26 DIAGNOSIS — M9903 Segmental and somatic dysfunction of lumbar region: Secondary | ICD-10-CM | POA: Diagnosis not present

## 2016-11-26 DIAGNOSIS — M5414 Radiculopathy, thoracic region: Secondary | ICD-10-CM | POA: Diagnosis not present

## 2016-11-26 DIAGNOSIS — M9902 Segmental and somatic dysfunction of thoracic region: Secondary | ICD-10-CM | POA: Diagnosis not present

## 2016-12-03 ENCOUNTER — Other Ambulatory Visit: Payer: Self-pay | Admitting: Physician Assistant

## 2016-12-03 DIAGNOSIS — F329 Major depressive disorder, single episode, unspecified: Secondary | ICD-10-CM

## 2016-12-03 DIAGNOSIS — E038 Other specified hypothyroidism: Secondary | ICD-10-CM

## 2016-12-03 DIAGNOSIS — E039 Hypothyroidism, unspecified: Secondary | ICD-10-CM

## 2016-12-03 DIAGNOSIS — F32A Depression, unspecified: Secondary | ICD-10-CM

## 2016-12-04 DIAGNOSIS — M5414 Radiculopathy, thoracic region: Secondary | ICD-10-CM | POA: Diagnosis not present

## 2016-12-04 DIAGNOSIS — M9902 Segmental and somatic dysfunction of thoracic region: Secondary | ICD-10-CM | POA: Diagnosis not present

## 2016-12-04 DIAGNOSIS — M62830 Muscle spasm of back: Secondary | ICD-10-CM | POA: Diagnosis not present

## 2016-12-04 DIAGNOSIS — M9903 Segmental and somatic dysfunction of lumbar region: Secondary | ICD-10-CM | POA: Diagnosis not present

## 2016-12-13 DIAGNOSIS — M9902 Segmental and somatic dysfunction of thoracic region: Secondary | ICD-10-CM | POA: Diagnosis not present

## 2016-12-13 DIAGNOSIS — M5414 Radiculopathy, thoracic region: Secondary | ICD-10-CM | POA: Diagnosis not present

## 2016-12-13 DIAGNOSIS — M9903 Segmental and somatic dysfunction of lumbar region: Secondary | ICD-10-CM | POA: Diagnosis not present

## 2016-12-13 DIAGNOSIS — M62830 Muscle spasm of back: Secondary | ICD-10-CM | POA: Diagnosis not present

## 2016-12-14 DIAGNOSIS — K529 Noninfective gastroenteritis and colitis, unspecified: Secondary | ICD-10-CM | POA: Diagnosis not present

## 2016-12-14 DIAGNOSIS — K91850 Pouchitis: Secondary | ICD-10-CM | POA: Diagnosis not present

## 2016-12-20 DIAGNOSIS — M9902 Segmental and somatic dysfunction of thoracic region: Secondary | ICD-10-CM | POA: Diagnosis not present

## 2016-12-20 DIAGNOSIS — M62830 Muscle spasm of back: Secondary | ICD-10-CM | POA: Diagnosis not present

## 2016-12-20 DIAGNOSIS — M5414 Radiculopathy, thoracic region: Secondary | ICD-10-CM | POA: Diagnosis not present

## 2016-12-20 DIAGNOSIS — M9903 Segmental and somatic dysfunction of lumbar region: Secondary | ICD-10-CM | POA: Diagnosis not present

## 2016-12-24 ENCOUNTER — Other Ambulatory Visit: Payer: Self-pay | Admitting: Physician Assistant

## 2016-12-24 ENCOUNTER — Other Ambulatory Visit: Payer: Self-pay

## 2016-12-24 DIAGNOSIS — F5101 Primary insomnia: Secondary | ICD-10-CM

## 2016-12-24 MED ORDER — TRAZODONE HCL 100 MG PO TABS: 200.0000 mg | ORAL_TABLET | Freq: Every day | ORAL | 5 refills | Status: DC

## 2016-12-24 MED ORDER — TRAZODONE HCL 100 MG PO TABS
200.0000 mg | ORAL_TABLET | Freq: Every day | ORAL | 5 refills | Status: DC
Start: 2016-12-24 — End: 2017-02-14

## 2016-12-24 NOTE — Telephone Encounter (Signed)
[facility] following medication.  Thanks CC  traZODone (DESYREL) 100 MG tablet  Take 2 tablets by mouth at bedtime.

## 2016-12-26 DIAGNOSIS — M9902 Segmental and somatic dysfunction of thoracic region: Secondary | ICD-10-CM | POA: Diagnosis not present

## 2016-12-26 DIAGNOSIS — M9903 Segmental and somatic dysfunction of lumbar region: Secondary | ICD-10-CM | POA: Diagnosis not present

## 2016-12-26 DIAGNOSIS — M5414 Radiculopathy, thoracic region: Secondary | ICD-10-CM | POA: Diagnosis not present

## 2016-12-26 DIAGNOSIS — M62830 Muscle spasm of back: Secondary | ICD-10-CM | POA: Diagnosis not present

## 2017-01-03 DIAGNOSIS — M9903 Segmental and somatic dysfunction of lumbar region: Secondary | ICD-10-CM | POA: Diagnosis not present

## 2017-01-03 DIAGNOSIS — M5414 Radiculopathy, thoracic region: Secondary | ICD-10-CM | POA: Diagnosis not present

## 2017-01-03 DIAGNOSIS — M9902 Segmental and somatic dysfunction of thoracic region: Secondary | ICD-10-CM | POA: Diagnosis not present

## 2017-01-03 DIAGNOSIS — M62830 Muscle spasm of back: Secondary | ICD-10-CM | POA: Diagnosis not present

## 2017-01-09 DIAGNOSIS — M5414 Radiculopathy, thoracic region: Secondary | ICD-10-CM | POA: Diagnosis not present

## 2017-01-09 DIAGNOSIS — M9902 Segmental and somatic dysfunction of thoracic region: Secondary | ICD-10-CM | POA: Diagnosis not present

## 2017-01-09 DIAGNOSIS — M62830 Muscle spasm of back: Secondary | ICD-10-CM | POA: Diagnosis not present

## 2017-01-09 DIAGNOSIS — M9903 Segmental and somatic dysfunction of lumbar region: Secondary | ICD-10-CM | POA: Diagnosis not present

## 2017-01-14 DIAGNOSIS — J309 Allergic rhinitis, unspecified: Secondary | ICD-10-CM | POA: Diagnosis not present

## 2017-01-15 DIAGNOSIS — M9903 Segmental and somatic dysfunction of lumbar region: Secondary | ICD-10-CM | POA: Diagnosis not present

## 2017-01-15 DIAGNOSIS — M9902 Segmental and somatic dysfunction of thoracic region: Secondary | ICD-10-CM | POA: Diagnosis not present

## 2017-01-15 DIAGNOSIS — M5414 Radiculopathy, thoracic region: Secondary | ICD-10-CM | POA: Diagnosis not present

## 2017-01-15 DIAGNOSIS — M62830 Muscle spasm of back: Secondary | ICD-10-CM | POA: Diagnosis not present

## 2017-01-22 DIAGNOSIS — M62830 Muscle spasm of back: Secondary | ICD-10-CM | POA: Diagnosis not present

## 2017-01-22 DIAGNOSIS — M5414 Radiculopathy, thoracic region: Secondary | ICD-10-CM | POA: Diagnosis not present

## 2017-01-22 DIAGNOSIS — M9903 Segmental and somatic dysfunction of lumbar region: Secondary | ICD-10-CM | POA: Diagnosis not present

## 2017-01-22 DIAGNOSIS — M9902 Segmental and somatic dysfunction of thoracic region: Secondary | ICD-10-CM | POA: Diagnosis not present

## 2017-01-29 DIAGNOSIS — M9903 Segmental and somatic dysfunction of lumbar region: Secondary | ICD-10-CM | POA: Diagnosis not present

## 2017-01-29 DIAGNOSIS — M9902 Segmental and somatic dysfunction of thoracic region: Secondary | ICD-10-CM | POA: Diagnosis not present

## 2017-01-29 DIAGNOSIS — M62830 Muscle spasm of back: Secondary | ICD-10-CM | POA: Diagnosis not present

## 2017-01-29 DIAGNOSIS — M5414 Radiculopathy, thoracic region: Secondary | ICD-10-CM | POA: Diagnosis not present

## 2017-01-31 DIAGNOSIS — K523 Indeterminate colitis: Secondary | ICD-10-CM | POA: Diagnosis not present

## 2017-01-31 DIAGNOSIS — K91850 Pouchitis: Secondary | ICD-10-CM | POA: Diagnosis not present

## 2017-01-31 DIAGNOSIS — K624 Stenosis of anus and rectum: Secondary | ICD-10-CM | POA: Diagnosis not present

## 2017-02-05 DIAGNOSIS — M9903 Segmental and somatic dysfunction of lumbar region: Secondary | ICD-10-CM | POA: Diagnosis not present

## 2017-02-05 DIAGNOSIS — M9902 Segmental and somatic dysfunction of thoracic region: Secondary | ICD-10-CM | POA: Diagnosis not present

## 2017-02-05 DIAGNOSIS — M62830 Muscle spasm of back: Secondary | ICD-10-CM | POA: Diagnosis not present

## 2017-02-05 DIAGNOSIS — M5414 Radiculopathy, thoracic region: Secondary | ICD-10-CM | POA: Diagnosis not present

## 2017-02-08 ENCOUNTER — Encounter: Payer: Self-pay | Admitting: Physician Assistant

## 2017-02-12 DIAGNOSIS — M9903 Segmental and somatic dysfunction of lumbar region: Secondary | ICD-10-CM | POA: Diagnosis not present

## 2017-02-12 DIAGNOSIS — M9902 Segmental and somatic dysfunction of thoracic region: Secondary | ICD-10-CM | POA: Diagnosis not present

## 2017-02-12 DIAGNOSIS — M5414 Radiculopathy, thoracic region: Secondary | ICD-10-CM | POA: Diagnosis not present

## 2017-02-12 DIAGNOSIS — M62830 Muscle spasm of back: Secondary | ICD-10-CM | POA: Diagnosis not present

## 2017-02-14 ENCOUNTER — Other Ambulatory Visit: Payer: Self-pay | Admitting: Physician Assistant

## 2017-02-14 DIAGNOSIS — F5101 Primary insomnia: Secondary | ICD-10-CM

## 2017-02-14 MED ORDER — TRAZODONE HCL 100 MG PO TABS: 200.0000 mg | ORAL_TABLET | Freq: Every day | ORAL | 1 refills | Status: DC

## 2017-02-14 NOTE — Telephone Encounter (Signed)
[facility] 90-days supply for the following medication. Thanks CC  traZODone (DESYREL) 100 MG tablet  Take 2 tablets by mouth at bedtime.

## 2017-02-14 NOTE — Telephone Encounter (Signed)
Refill sent in to [facility]

## 2017-02-19 DIAGNOSIS — M9902 Segmental and somatic dysfunction of thoracic region: Secondary | ICD-10-CM | POA: Diagnosis not present

## 2017-02-19 DIAGNOSIS — M5414 Radiculopathy, thoracic region: Secondary | ICD-10-CM | POA: Diagnosis not present

## 2017-02-19 DIAGNOSIS — M9903 Segmental and somatic dysfunction of lumbar region: Secondary | ICD-10-CM | POA: Diagnosis not present

## 2017-02-19 DIAGNOSIS — M62830 Muscle spasm of back: Secondary | ICD-10-CM | POA: Diagnosis not present

## 2017-03-20 ENCOUNTER — Encounter: Payer: Self-pay | Admitting: Physician Assistant

## 2017-04-27 DIAGNOSIS — M9902 Segmental and somatic dysfunction of thoracic region: Secondary | ICD-10-CM | POA: Diagnosis not present

## 2017-04-27 DIAGNOSIS — M5414 Radiculopathy, thoracic region: Secondary | ICD-10-CM | POA: Diagnosis not present

## 2017-04-27 DIAGNOSIS — M62830 Muscle spasm of back: Secondary | ICD-10-CM | POA: Diagnosis not present

## 2017-04-27 DIAGNOSIS — M9903 Segmental and somatic dysfunction of lumbar region: Secondary | ICD-10-CM | POA: Diagnosis not present

## 2017-04-29 DIAGNOSIS — M62830 Muscle spasm of back: Secondary | ICD-10-CM | POA: Diagnosis not present

## 2017-04-29 DIAGNOSIS — M9902 Segmental and somatic dysfunction of thoracic region: Secondary | ICD-10-CM | POA: Diagnosis not present

## 2017-04-29 DIAGNOSIS — M9903 Segmental and somatic dysfunction of lumbar region: Secondary | ICD-10-CM | POA: Diagnosis not present

## 2017-04-29 DIAGNOSIS — M5414 Radiculopathy, thoracic region: Secondary | ICD-10-CM | POA: Diagnosis not present

## 2017-04-30 ENCOUNTER — Other Ambulatory Visit: Payer: Self-pay | Admitting: Physician Assistant

## 2017-05-02 DIAGNOSIS — M5414 Radiculopathy, thoracic region: Secondary | ICD-10-CM | POA: Diagnosis not present

## 2017-05-02 DIAGNOSIS — M9902 Segmental and somatic dysfunction of thoracic region: Secondary | ICD-10-CM | POA: Diagnosis not present

## 2017-05-02 DIAGNOSIS — M9903 Segmental and somatic dysfunction of lumbar region: Secondary | ICD-10-CM | POA: Diagnosis not present

## 2017-05-02 DIAGNOSIS — M62830 Muscle spasm of back: Secondary | ICD-10-CM | POA: Diagnosis not present

## 2017-05-07 DIAGNOSIS — M9903 Segmental and somatic dysfunction of lumbar region: Secondary | ICD-10-CM | POA: Diagnosis not present

## 2017-05-07 DIAGNOSIS — M9902 Segmental and somatic dysfunction of thoracic region: Secondary | ICD-10-CM | POA: Diagnosis not present

## 2017-05-07 DIAGNOSIS — M62830 Muscle spasm of back: Secondary | ICD-10-CM | POA: Diagnosis not present

## 2017-05-07 DIAGNOSIS — M5414 Radiculopathy, thoracic region: Secondary | ICD-10-CM | POA: Diagnosis not present

## 2017-05-08 ENCOUNTER — Other Ambulatory Visit: Payer: Self-pay | Admitting: Physician Assistant

## 2017-05-08 DIAGNOSIS — E039 Hypothyroidism, unspecified: Secondary | ICD-10-CM

## 2017-05-08 DIAGNOSIS — E038 Other specified hypothyroidism: Secondary | ICD-10-CM

## 2017-05-10 DIAGNOSIS — M9902 Segmental and somatic dysfunction of thoracic region: Secondary | ICD-10-CM | POA: Diagnosis not present

## 2017-05-10 DIAGNOSIS — M5414 Radiculopathy, thoracic region: Secondary | ICD-10-CM | POA: Diagnosis not present

## 2017-05-10 DIAGNOSIS — M62830 Muscle spasm of back: Secondary | ICD-10-CM | POA: Diagnosis not present

## 2017-05-10 DIAGNOSIS — M9903 Segmental and somatic dysfunction of lumbar region: Secondary | ICD-10-CM | POA: Diagnosis not present

## 2017-05-13 DIAGNOSIS — M9902 Segmental and somatic dysfunction of thoracic region: Secondary | ICD-10-CM | POA: Diagnosis not present

## 2017-05-13 DIAGNOSIS — M9903 Segmental and somatic dysfunction of lumbar region: Secondary | ICD-10-CM | POA: Diagnosis not present

## 2017-05-13 DIAGNOSIS — M5414 Radiculopathy, thoracic region: Secondary | ICD-10-CM | POA: Diagnosis not present

## 2017-05-13 DIAGNOSIS — M62830 Muscle spasm of back: Secondary | ICD-10-CM | POA: Diagnosis not present

## 2017-06-01 ENCOUNTER — Other Ambulatory Visit: Payer: Self-pay | Admitting: Physician Assistant

## 2017-06-01 DIAGNOSIS — F329 Major depressive disorder, single episode, unspecified: Secondary | ICD-10-CM

## 2017-06-01 DIAGNOSIS — F32A Depression, unspecified: Secondary | ICD-10-CM

## 2017-06-21 DIAGNOSIS — K91850 Pouchitis: Secondary | ICD-10-CM | POA: Diagnosis not present

## 2017-06-21 DIAGNOSIS — K529 Noninfective gastroenteritis and colitis, unspecified: Secondary | ICD-10-CM | POA: Diagnosis not present

## 2017-06-21 DIAGNOSIS — F419 Anxiety disorder, unspecified: Secondary | ICD-10-CM | POA: Diagnosis not present

## 2017-06-21 DIAGNOSIS — Z23 Encounter for immunization: Secondary | ICD-10-CM | POA: Diagnosis not present

## 2017-06-21 DIAGNOSIS — Z9049 Acquired absence of other specified parts of digestive tract: Secondary | ICD-10-CM | POA: Diagnosis not present

## 2017-06-27 DIAGNOSIS — D225 Melanocytic nevi of trunk: Secondary | ICD-10-CM | POA: Diagnosis not present

## 2017-06-27 DIAGNOSIS — D2272 Melanocytic nevi of left lower limb, including hip: Secondary | ICD-10-CM | POA: Diagnosis not present

## 2017-06-27 DIAGNOSIS — D485 Neoplasm of uncertain behavior of skin: Secondary | ICD-10-CM | POA: Diagnosis not present

## 2017-06-27 DIAGNOSIS — Z1283 Encounter for screening for malignant neoplasm of skin: Secondary | ICD-10-CM | POA: Diagnosis not present

## 2017-07-10 DIAGNOSIS — M5414 Radiculopathy, thoracic region: Secondary | ICD-10-CM | POA: Diagnosis not present

## 2017-07-10 DIAGNOSIS — M9902 Segmental and somatic dysfunction of thoracic region: Secondary | ICD-10-CM | POA: Diagnosis not present

## 2017-07-10 DIAGNOSIS — M62830 Muscle spasm of back: Secondary | ICD-10-CM | POA: Diagnosis not present

## 2017-07-10 DIAGNOSIS — M9903 Segmental and somatic dysfunction of lumbar region: Secondary | ICD-10-CM | POA: Diagnosis not present

## 2017-07-12 DIAGNOSIS — M62830 Muscle spasm of back: Secondary | ICD-10-CM | POA: Diagnosis not present

## 2017-07-12 DIAGNOSIS — M5414 Radiculopathy, thoracic region: Secondary | ICD-10-CM | POA: Diagnosis not present

## 2017-07-12 DIAGNOSIS — M9903 Segmental and somatic dysfunction of lumbar region: Secondary | ICD-10-CM | POA: Diagnosis not present

## 2017-07-12 DIAGNOSIS — M9902 Segmental and somatic dysfunction of thoracic region: Secondary | ICD-10-CM | POA: Diagnosis not present

## 2017-07-17 DIAGNOSIS — M62830 Muscle spasm of back: Secondary | ICD-10-CM | POA: Diagnosis not present

## 2017-07-17 DIAGNOSIS — M5414 Radiculopathy, thoracic region: Secondary | ICD-10-CM | POA: Diagnosis not present

## 2017-07-17 DIAGNOSIS — M9902 Segmental and somatic dysfunction of thoracic region: Secondary | ICD-10-CM | POA: Diagnosis not present

## 2017-07-17 DIAGNOSIS — M9903 Segmental and somatic dysfunction of lumbar region: Secondary | ICD-10-CM | POA: Diagnosis not present

## 2017-07-20 DIAGNOSIS — M9903 Segmental and somatic dysfunction of lumbar region: Secondary | ICD-10-CM | POA: Diagnosis not present

## 2017-07-20 DIAGNOSIS — M5414 Radiculopathy, thoracic region: Secondary | ICD-10-CM | POA: Diagnosis not present

## 2017-07-20 DIAGNOSIS — M9902 Segmental and somatic dysfunction of thoracic region: Secondary | ICD-10-CM | POA: Diagnosis not present

## 2017-07-20 DIAGNOSIS — M62830 Muscle spasm of back: Secondary | ICD-10-CM | POA: Diagnosis not present

## 2017-08-02 DIAGNOSIS — M545 Low back pain: Secondary | ICD-10-CM | POA: Diagnosis not present

## 2017-08-06 DIAGNOSIS — M545 Low back pain: Secondary | ICD-10-CM | POA: Diagnosis not present

## 2017-08-08 DIAGNOSIS — M545 Low back pain: Secondary | ICD-10-CM | POA: Diagnosis not present

## 2017-08-11 ENCOUNTER — Other Ambulatory Visit: Payer: Self-pay | Admitting: Physician Assistant

## 2017-08-11 DIAGNOSIS — F5101 Primary insomnia: Secondary | ICD-10-CM

## 2017-08-13 DIAGNOSIS — M545 Low back pain: Secondary | ICD-10-CM | POA: Diagnosis not present

## 2017-08-13 NOTE — Telephone Encounter (Signed)
Last ov 06/05/16

## 2017-08-15 DIAGNOSIS — M545 Low back pain: Secondary | ICD-10-CM | POA: Diagnosis not present

## 2017-08-20 DIAGNOSIS — M545 Low back pain: Secondary | ICD-10-CM | POA: Diagnosis not present

## 2017-08-22 DIAGNOSIS — M545 Low back pain: Secondary | ICD-10-CM | POA: Diagnosis not present

## 2017-08-26 DIAGNOSIS — M545 Low back pain: Secondary | ICD-10-CM | POA: Diagnosis not present

## 2017-08-29 DIAGNOSIS — M545 Low back pain: Secondary | ICD-10-CM | POA: Diagnosis not present

## 2017-09-04 DIAGNOSIS — M545 Low back pain: Secondary | ICD-10-CM | POA: Diagnosis not present

## 2017-09-06 DIAGNOSIS — M545 Low back pain: Secondary | ICD-10-CM | POA: Diagnosis not present

## 2017-09-10 DIAGNOSIS — M545 Low back pain: Secondary | ICD-10-CM | POA: Diagnosis not present

## 2017-09-12 DIAGNOSIS — M545 Low back pain: Secondary | ICD-10-CM | POA: Diagnosis not present

## 2017-09-17 DIAGNOSIS — M545 Low back pain: Secondary | ICD-10-CM | POA: Diagnosis not present

## 2017-09-19 DIAGNOSIS — M545 Low back pain: Secondary | ICD-10-CM | POA: Diagnosis not present

## 2017-09-27 ENCOUNTER — Encounter: Payer: Self-pay | Admitting: Physician Assistant

## 2017-09-30 ENCOUNTER — Encounter: Payer: Self-pay | Admitting: Physician Assistant

## 2017-09-30 ENCOUNTER — Ambulatory Visit (INDEPENDENT_AMBULATORY_CARE_PROVIDER_SITE_OTHER): Payer: BLUE CROSS/BLUE SHIELD | Admitting: Physician Assistant

## 2017-09-30 VITALS — BP 122/70 | HR 86 | Temp 98.2°F | Resp 16 | Ht 63.0 in | Wt 130.0 lb

## 2017-09-30 DIAGNOSIS — E038 Other specified hypothyroidism: Secondary | ICD-10-CM

## 2017-09-30 DIAGNOSIS — E46 Unspecified protein-calorie malnutrition: Secondary | ICD-10-CM | POA: Insufficient documentation

## 2017-09-30 DIAGNOSIS — G43809 Other migraine, not intractable, without status migrainosus: Secondary | ICD-10-CM | POA: Diagnosis not present

## 2017-09-30 DIAGNOSIS — F419 Anxiety disorder, unspecified: Secondary | ICD-10-CM | POA: Diagnosis not present

## 2017-09-30 DIAGNOSIS — Z9109 Other allergy status, other than to drugs and biological substances: Secondary | ICD-10-CM

## 2017-09-30 DIAGNOSIS — K51019 Ulcerative (chronic) pancolitis with unspecified complications: Secondary | ICD-10-CM

## 2017-09-30 DIAGNOSIS — Z0000 Encounter for general adult medical examination without abnormal findings: Secondary | ICD-10-CM

## 2017-09-30 DIAGNOSIS — K91850 Pouchitis: Secondary | ICD-10-CM

## 2017-09-30 DIAGNOSIS — F5101 Primary insomnia: Secondary | ICD-10-CM

## 2017-09-30 DIAGNOSIS — E039 Hypothyroidism, unspecified: Secondary | ICD-10-CM | POA: Diagnosis not present

## 2017-09-30 MED ORDER — TRAZODONE HCL 100 MG PO TABS: 200.0000 mg | ORAL_TABLET | Freq: Every day | ORAL | 0 refills | Status: DC

## 2017-09-30 MED ORDER — MONTELUKAST SODIUM 10 MG PO TABS: ORAL_TABLET | ORAL | 1 refills | Status: DC

## 2017-09-30 MED ORDER — ALPRAZOLAM 0.5 MG PO TABS
0.5000 mg | ORAL_TABLET | Freq: Two times a day (BID) | ORAL | 5 refills | Status: AC | PRN
Start: 2017-09-30 — End: ?

## 2017-09-30 MED ORDER — LEVOTHYROXINE SODIUM 50 MCG PO TABS: 50.0000 ug | ORAL_TABLET | Freq: Every day | ORAL | 1 refills | Status: DC

## 2017-09-30 MED ORDER — VITAMIN D (ERGOCALCIFEROL) 1.25 MG (50000 UNIT) PO CAPS: 50000.0000 [IU] | ORAL_CAPSULE | ORAL | 2 refills | Status: DC

## 2017-09-30 MED ORDER — PROMETHAZINE HCL 25 MG PO TABS
25.0000 mg | ORAL_TABLET | ORAL | 5 refills | Status: DC | PRN
Start: 2017-09-30 — End: 2018-11-17

## 2017-09-30 NOTE — Patient Instructions (Signed)

## 2017-09-30 NOTE — Progress Notes (Signed)
Patient: Mariah Nguyen, Female    DOB: April 29, 1971, 47 y.o.   MRN: [id number] Visit Date: 09/30/2017  Today's Provider: Mar Daring, PA-C   Chief Complaint  Patient presents with  . Annual Exam   Subjective:    Annual physical exam Mariah Nguyen is a 47 y.o. female who presents today for health maintenance and complete physical. She feels well. She reports exercising regularly. She reports she is sleeping fairly well.  Mammogram- 11/19/2016. Normal. Reports having left breast cysts. She is already seeing a Psychologist, sport and exercise for this issue.   Patient declines pap today due to having hysterectomy. Has no complaints today.   She does have UC and is followed by GI, most recently seen on 06/21/17. She has had a total colectomy secondary to UC. 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 This also causes malabsorption and she takes multivitamins for this.   She also discontinued sertraline. Continues to take trazodone 2032mto help her sleep. Uses CBD oil for anxiety to help sleep. Xanax only prn.   Review of Systems  Constitutional: Negative.   HENT: Positive for congestion.   Eyes: Negative.   Respiratory: Negative.   Cardiovascular: Negative.   Gastrointestinal: Negative.   Endocrine: Negative.   Genitourinary: Negative.   Musculoskeletal: Positive for arthralgias, back pain and neck pain.  Skin: Negative.   Allergic/Immunologic: Positive for environmental allergies.  Neurological: Positive for headaches.  Hematological: Negative.   Psychiatric/Behavioral: Positive for sleep disturbance. The patient is nervous/anxious.     Social History      She  reports that  has never smoked. she has never used smokeless tobacco. She reports that she drinks alcohol. She reports that she does not use drugs.       Social History   Socioeconomic History  . Marital status: Married    Spouse name: None  . Number of children: None  . Years of  education: None  . Highest education level: None  Social Needs  . Financial resource strain: None  . Food insecurity - worry: None  . Food insecurity - inability: None  . Transportation needs - medical: None  . Transportation needs - non-medical: None  Occupational History  . None  Tobacco Use  . Smoking status: Never Smoker  . Smokeless tobacco: Never Used  Substance and Sexual Activity  . Alcohol use: Yes    Alcohol/week: 0.0 oz    Comment: Occasionally  . Drug use: No  . Sexual activity: None  Other Topics Concern  . None  Social History Narrative  . None    Past Medical History:  Diagnosis Date  . Exercise-induced asthma   . Ulcerative colitis ([place]     Patient Active Problem List   Diagnosis Date Noted  . 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 Shoulder pain 10/07/2015  . Subclinical hypothyroidism 09/27/2015  . Asthma 06/09/2015  . Anxiety 06/09/2015  . Clinical depression 06/09/2015  . Cannot sleep 06/09/2015  . Migraine 06/09/2015  . 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 Exercise-induced asthma with acute exacerbation 06/09/2015  . Beat, premature ventricular 01/21/2014  . Bowel disease, inflammatory 10/19/2013  . Family history of breast cancer 02/19/2013  . Chronic ulcerative enterocolitis ([place]CP[place]05/04/2013  . Allergy to environmental factors 07/01/2012  . Ileal pouchitis (HCD[place]10/19/2012    Past Surgical History:  Procedure Laterality Date  . CESAREAN SECTION  2002  . PARTIAL HYSTERECTOMY  08/2007  . Right  eye surgery  07/15/12   [facility]  . Stripping of varicose veins in legs    . TONSILLECTOMY AND ADENOIDECTOMY  01/2005  . Total proctocolectomy with J-pouch  1997   History of recurrent pouchitis    Family History        No family status information on file.        Her family history is not on file.     Allergies  Allergen Reactions  . Bentyl  [Dicyclomine]   . Meperidine   . Metronidazole   . Povidone Iodine   . Sulfa Antibiotics       Current Outpatient Medications:  .  ACETAMINOPHEN ER PO, , Disp: , Rfl:  .  albuterol (VENTOLIN HFA) 108 (90 BASE) MCG/ACT inhaler, Inhale 2 puffs into the lungs every 6 (six) hours as needed for wheezing or shortness of breath., Disp: 1 Inhaler, Rfl: 2 .  beclomethasone (QVAR) 40 MCG/ACT inhaler, Inhale 2 puffs into the lungs 2 (two) times daily as needed., Disp: 1 Inhaler, Rfl: 2 .  busPIRone (BUSPAR) 15 MG tablet, Take by mouth. Reported on 09/27/2015, Disp: , Rfl:  .  cetirizine (ZYRTEC ALLERGY) 10 MG tablet, Take by mouth., Disp: , Rfl:  .  Cranberry Fruit 405 MG CAPS, Take by mouth., Disp: , Rfl:  .  cyclobenzaprine (FLEXERIL) 10 MG tablet, Take 1 tablet (10 mg total) by mouth at bedtime., Disp: 30 tablet, Rfl: 5 .  diphenoxylate-atropine (LOMOTIL) 2.5-0.025 MG tablet, Take by mouth., Disp: , Rfl:  .  EPINEPHrine (EPIPEN 2-PAK) 0.3 mg/0.3 mL IJ SOAJ injection, EPIPEN 2-PAK, 0.3MG/0.3ML (Injection Solution Auto-injector)  1 (one) Soln Auto-inj as directed for 0 days  Quantity: 1;  Refills: 0   Ordered :13-Jan-2015  Margarita Rana MD;  Started 13-Jan-2015 Active Comments: Needs ov scheduled. Thanks- Dr. Jerilynn Mages., Disp: , Rfl:  .  levothyroxine (SYNTHROID, LEVOTHROID) 50 MCG tablet, TAKE 1 TABLET (50 MCG TOTAL) BY MOUTH DAILY BEFORE BREAKFAST., Disp: 90 tablet, Rfl: 1 .  montelukast (SINGULAIR) 10 MG tablet, TAKE 1 TABLET EVERY DAY AS DIRECTED, Disp: 30 tablet, Rfl: 5 .  NYSTATIN powder, APPLY TO AFFECTED AREA TWICE A DAY, Disp: 60 g, Rfl: 0 .  traZODone (DESYREL) 100 MG tablet, TAKE 2 TABLETS (200 MG TOTAL) BY MOUTH AT BEDTIME., Disp: 180 tablet, Rfl: 0 .  Vitamin D, Ergocalciferol, (DRISDOL) 50000 units CAPS capsule, , Disp: , Rfl: 2 .  ALPRAZolam (XANAX) 0.5 MG tablet, Take 1 tablet (0.5 mg total) by mouth 2 (two) times daily as needed for anxiety. (Patient not taking: Reported on 06/05/2016), Disp: 60 tablet, Rfl: 5 .  ciprofloxacin (CIPRO) 500 MG tablet, Take 500 mg by mouth 1 day or 1  dose., Disp: , Rfl:  .  cycloSPORINE (RESTASIS) 0.05 % ophthalmic emulsion, Apply to eye., Disp: , Rfl:  .  promethazine (PHENERGAN) 25 MG tablet, TAKE 1 TABLET BY MOUTH EVERY 4 HOURS AS NEEDED (Patient not taking: Reported on 06/05/2016), Disp: 30 tablet, Rfl: 0 .  sertraline (ZOLOFT) 25 MG tablet, TAKE 1 TABLET BY MOUTH 3 TIMES A DAY (Patient not taking: Reported on 09/30/2017), Disp: 270 tablet, Rfl: 1 .  terconazole (TERAZOL 3) 0.8 % vaginal cream, Insert vaginally once weekly (Patient not taking: Reported on 09/30/2017), Disp: 20 g, Rfl: 3   Patient Care Team: Mar Daring, PA-C as PCP - General (Family Medicine)      Objective:   Vitals: BP 122/70 (BP Location: Right Arm, Patient Position: Sitting, Cuff Size: Normal)  Pulse 86   Temp 98.2 F (36.8 C)   Resp 16   Ht 5' 3"  (1.6 m)   Wt 130 lb (59 kg)   SpO2 99%   BMI 23.03 kg/m    Vitals:   09/30/17 1018  BP: 122/70  Pulse: 86  Resp: 16  Temp: 98.2 F (36.8 C)  SpO2: 99%  Weight: 130 lb (59 kg)  Height: 5' 3"  (1.6 m)     Physical Exam  Constitutional: She is oriented to person, place, and time. She appears well-developed and well-nourished. No distress.  HENT:  Head: Normocephalic and atraumatic.  Right Ear: Hearing, tympanic membrane, external ear and ear canal normal.  Left Ear: Hearing, tympanic membrane, external ear and ear canal normal.  Nose: Nose normal.  Mouth/Throat: Uvula is midline, oropharynx is clear and moist and mucous membranes are normal. No oropharyngeal exudate.  Eyes: Conjunctivae and EOM are normal. Pupils are equal, round, and reactive to light. Right eye exhibits no discharge. Left eye exhibits no discharge. No scleral icterus.  Neck: Normal range of motion. Neck supple. No JVD present. No tracheal deviation present. No thyromegaly present.  Cardiovascular: Normal rate, regular rhythm, normal heart sounds and intact distal pulses. Exam reveals no gallop and no friction rub.  No murmur  heard. Pulmonary/Chest: Effort normal and breath sounds normal. No respiratory distress. She has no wheezes. She has no rales. She exhibits no tenderness.  Abdominal: Soft. Bowel sounds are normal. She exhibits no distension and no mass. There is no tenderness. There is no rebound and no guarding.  Musculoskeletal: Normal range of motion. She exhibits no edema or tenderness.  Lymphadenopathy:    She has no cervical adenopathy.  Neurological: She is alert and oriented to person, place, and time.  Skin: Skin is warm and dry. No rash noted. She is not diaphoretic.  Psychiatric: She has a normal mood and affect. Her behavior is normal. Judgment and thought content normal.  Vitals reviewed.    Depression Screen No flowsheet data found.     Assessment & Plan:     Routine Health Maintenance and Physical Exam  Exercise Activities and Dietary recommendations Goals    None      Immunization History  Administered Date(s) Administered  . Influenza Split 07/18/2005, 05/22/2013  . Influenza,inj,Quad PF,6+ Mos 06/12/2014, 07/14/2015, 06/05/2016  . Pneumococcal Polysaccharide-23 07/14/2015  . Td 01/22/2011  . Tdap 01/22/2011    Health Maintenance  Topic Date Due  . HIV Screening  05/15/1984  . PAP SMEAR  05/15/1990  . INFLUENZA VACCINE  04/03/2017  . TETANUS/TDAP  01/21/2021     Discussed health benefits of physical activity, and encouraged her to engage in regular exercise appropriate for her age and condition.    1. Annual physical exam Normal physical exam today. Will check labs as below and f/u pending lab results. If labs are stable and WNL she will not need to have these rechecked for one year at her next annual physical exam. She is to call the office in the meantime if she has any acute issue, questions or concerns. - CBC w/Diff/Platelet - Comprehensive Metabolic Panel (CMET) - T4 AND TSH - Lipid Profile  2. Primary insomnia Stable. Diagnosis pulled for medication  refill. Continue current medical treatment plan. - traZODone (DESYREL) 100 MG tablet; Take 2 tablets (200 mg total) by mouth at bedtime.  Dispense: 180 tablet; Refill: 0  3. Other migraine without status migrainosus, not intractable Stable. Diagnosis pulled for medication refill. Continue  current medical treatment plan. - promethazine (PHENERGAN) 25 MG tablet; Take 1 tablet (25 mg total) by mouth every 4 (four) hours as needed.  Dispense: 30 tablet; Refill: 5  4. Allergy to environmental factors Stable. Diagnosis pulled for medication refill. Continue current medical treatment plan. - montelukast (SINGULAIR) 10 MG tablet; TAKE 1 TABLET EVERY DAY AS DIRECTED  Dispense: 90 tablet; Refill: 1  5. Subclinical hypothyroidism Stable. Diagnosis pulled for medication refill. Continue current medical treatment plan. Will check labs as below and f/u pending results. - levothyroxine (SYNTHROID, LEVOTHROID) 50 MCG tablet; Take 1 tablet (50 mcg total) by mouth daily before breakfast.  Dispense: 90 tablet; Refill: 1 - T4 AND TSH  6. Acute anxiety Stable. Does not use frequently. Using CBD oil more often now. Diagnosis pulled for medication refill. Continue current medical treatment plan. - ALPRAZolam (XANAX) 0.5 MG tablet; Take 1 tablet (0.5 mg total) by mouth 2 (two) times daily as needed for anxiety.  Dispense: 60 tablet; Refill: 5  7. Ileal pouchitis (HCC) Stable on Cipro 554m once daily dosing. [place] Will check labs as below and f/u pending results.  - CBC w/Diff/Platelet - Comprehensive Metabolic Panel (CMET) - BD31- Vitamin D (25 hydroxy) - Magnesium - Fe+TIBC+Fer  8. Chronic ulcerative enterocolitis, unspecified complication (HCC) S/P total colectomy. See above medical treatment plan. - CBC w/Diff/Platelet - Comprehensive Metabolic Panel (CMET) - BY38- Vitamin D (25 hydroxy) - Magnesium - Fe+TIBC+Fer  9. Malnutrition, unspecified type ([place] H/O this due to  total colectomy from UC and chronic pouchitis. Will check labs as below and f/u pending results. - CBC w/Diff/Platelet - Comprehensive Metabolic Panel (CMET) - BO87- Vitamin D (25 hydroxy) - Magnesium - Lipid Profile - Fe+TIBC+Fer - Vitamin D, Ergocalciferol, (DRISDOL) 50000 units CAPS capsule; Take 1 capsule (50,000 Units total) by mouth every 7 (seven) days.  Dispense: 12 capsule; Refill: 2  --------------------------------------------------------------------    JMar Daring PA-C  [facility]

## 2017-10-01 ENCOUNTER — Telehealth: Payer: Self-pay

## 2017-10-01 DIAGNOSIS — M545 Low back pain: Secondary | ICD-10-CM | POA: Diagnosis not present

## 2017-10-01 LAB — CBC WITH DIFFERENTIAL/PLATELET
BASOS ABS: 0.1 10*3/uL (ref 0.0–0.2)
Basos: 1 %
EOS (ABSOLUTE): 0.2 10*3/uL (ref 0.0–0.4)
Eos: 2 %
Hematocrit: 37.6 % (ref 34.0–46.6)
Hemoglobin: 12.6 g/dL (ref 11.1–15.9)
IMMATURE GRANS (ABS): 0 10*3/uL (ref 0.0–0.1)
Immature Granulocytes: 0 %
LYMPHS: 24 %
Lymphocytes Absolute: 1.8 10*3/uL (ref 0.7–3.1)
MCH: 27.9 pg (ref 26.6–33.0)
MCHC: 33.5 g/dL (ref 31.5–35.7)
MCV: 83 fL (ref 79–97)
Monocytes Absolute: 0.6 10*3/uL (ref 0.1–0.9)
Monocytes: 8 %
NEUTROS ABS: 4.8 10*3/uL (ref 1.4–7.0)
NEUTROS PCT: 65 %
PLATELETS: 241 10*3/uL (ref 150–379)
RBC: 4.52 x10E6/uL (ref 3.77–5.28)
RDW: 14 % (ref 12.3–15.4)
WBC: 7.3 10*3/uL (ref 3.4–10.8)

## 2017-10-01 LAB — COMPREHENSIVE METABOLIC PANEL
A/G RATIO: 1.6 (ref 1.2–2.2)
ALBUMIN: 4.1 g/dL (ref 3.5–5.5)
ALT: 14 IU/L (ref 0–32)
AST: 22 IU/L (ref 0–40)
Alkaline Phosphatase: 81 IU/L (ref 39–117)
BUN / CREAT RATIO: 9 (ref 9–23)
BUN: 8 mg/dL (ref 6–24)
Bilirubin Total: 0.2 mg/dL (ref 0.0–1.2)
CALCIUM: 9.1 mg/dL (ref 8.7–10.2)
CO2: 24 mmol/L (ref 20–29)
CREATININE: 0.85 mg/dL (ref 0.57–1.00)
Chloride: 104 mmol/L (ref 96–106)
GFR calc Af Amer: 94 mL/min/{1.73_m2} (ref >59)
GFR, EST NON AFRICAN AMERICAN: 81 mL/min/{1.73_m2} (ref >59)
GLOBULIN, TOTAL: 2.6 g/dL (ref 1.5–4.5)
Glucose: 91 mg/dL (ref 65–99)
POTASSIUM: 4.3 mmol/L (ref 3.5–5.2)
SODIUM: 143 mmol/L (ref 134–144)
Total Protein: 6.7 g/dL (ref 6.0–8.5)

## 2017-10-01 LAB — VITAMIN D 25 HYDROXY (VIT D DEFICIENCY, FRACTURES): Vit D, 25-Hydroxy: 32 ng/mL (ref 30.0–100.0)

## 2017-10-01 LAB — IRON,TIBC AND FERRITIN PANEL
FERRITIN: 39 ng/mL (ref 15–150)
Iron Saturation: 21 % (ref 15–55)
Iron: 71 ug/dL (ref 27–159)
Total Iron Binding Capacity: 336 ug/dL (ref 250–450)
UIBC: 265 ug/dL (ref 131–425)

## 2017-10-01 LAB — T4 AND TSH
T4 TOTAL: 8.2 ug/dL (ref 4.5–12.0)
TSH: 0.754 u[IU]/mL (ref 0.450–4.500)

## 2017-10-01 LAB — LIPID PANEL
CHOLESTEROL TOTAL: 206 mg/dL — AB (ref 100–199)
Chol/HDL Ratio: 4.5 ratio — ABNORMAL HIGH (ref 0.0–4.4)
HDL: 46 mg/dL (ref >39)
LDL CALC: 109 mg/dL — AB (ref 0–99)
TRIGLYCERIDES: 257 mg/dL — AB (ref 0–149)
VLDL CHOLESTEROL CAL: 51 mg/dL — AB (ref 5–40)

## 2017-10-01 LAB — MAGNESIUM: MAGNESIUM: 2.1 mg/dL (ref 1.6–2.3)

## 2017-10-01 LAB — VITAMIN B12: Vitamin B-12: 348 pg/mL (ref 232–1245)

## 2017-10-01 NOTE — Telephone Encounter (Signed)
NA. sd 

## 2017-10-01 NOTE — Telephone Encounter (Signed)
-----   Message from Mar Daring, [place] sent at 10/01/2017  1:00 PM EST ----- Cholesterol is up slightly from last year, but triglycerides are better. Continue lifestyle modifications. All other labs are all WNL and stable.

## 2017-10-03 DIAGNOSIS — M545 Low back pain: Secondary | ICD-10-CM | POA: Diagnosis not present

## 2017-10-04 NOTE — Telephone Encounter (Signed)
Patient has my chart active.

## 2017-10-10 DIAGNOSIS — N63 Unspecified lump in unspecified breast: Secondary | ICD-10-CM | POA: Diagnosis not present

## 2017-10-10 DIAGNOSIS — Z803 Family history of malignant neoplasm of breast: Secondary | ICD-10-CM | POA: Diagnosis not present

## 2017-10-10 DIAGNOSIS — Z6823 Body mass index (BMI) 23.0-23.9, adult: Secondary | ICD-10-CM | POA: Diagnosis not present

## 2017-10-10 DIAGNOSIS — M545 Low back pain: Secondary | ICD-10-CM | POA: Diagnosis not present

## 2017-10-10 DIAGNOSIS — N6321 Unspecified lump in the left breast, upper outer quadrant: Secondary | ICD-10-CM | POA: Diagnosis not present

## 2017-10-10 DIAGNOSIS — R928 Other abnormal and inconclusive findings on diagnostic imaging of breast: Secondary | ICD-10-CM | POA: Diagnosis not present

## 2017-11-07 ENCOUNTER — Other Ambulatory Visit: Payer: Self-pay | Admitting: Physician Assistant

## 2017-11-07 DIAGNOSIS — F5101 Primary insomnia: Secondary | ICD-10-CM

## 2017-11-07 MED ORDER — TRAZODONE HCL 100 MG PO TABS: 200.0000 mg | ORAL_TABLET | Freq: Every day | ORAL | 1 refills | Status: DC

## 2017-11-07 NOTE — Telephone Encounter (Signed)
Pt contacted office for refill request on the following medications:  traZODone (DESYREL) 100 MG tablet   [facility] [address]  Last Rx: 09/30/17 (90 day supply) LOV: 09/30/17 Please advise. Thanks TNP

## 2017-11-26 ENCOUNTER — Other Ambulatory Visit: Payer: Self-pay | Admitting: Physician Assistant

## 2017-11-26 DIAGNOSIS — E038 Other specified hypothyroidism: Secondary | ICD-10-CM

## 2017-11-26 DIAGNOSIS — E039 Hypothyroidism, unspecified: Secondary | ICD-10-CM

## 2017-11-26 MED ORDER — LEVOTHYROXINE SODIUM 50 MCG PO TABS: 50.0000 ug | ORAL_TABLET | Freq: Every day | ORAL | 1 refills | Status: DC

## 2017-11-26 NOTE — Telephone Encounter (Signed)
Pt would like a refill on her levothyroxine 50 mcg  [facility]  Pt's call back is [phone]  Thanks, Con Memos

## 2017-11-26 NOTE — Telephone Encounter (Signed)
Please review. Thanks!  

## 2017-12-26 ENCOUNTER — Telehealth: Payer: Self-pay | Admitting: *Deleted

## 2017-12-26 NOTE — Telephone Encounter (Signed)
Ok to send? Please advise. Thanks!

## 2017-12-26 NOTE — Telephone Encounter (Signed)
Patient called office requesting her lab results from 09/30/2017 be faxed to [facility] at [phone]. Patient especially wants the Vitamin D result sent. Thanks!

## 2017-12-26 NOTE — Telephone Encounter (Signed)
Yes ok to send but can they not access it through care everywhere?

## 2017-12-26 NOTE — Telephone Encounter (Signed)
Labs faxed to [facility] per patient request.

## 2018-01-20 DIAGNOSIS — J309 Allergic rhinitis, unspecified: Secondary | ICD-10-CM | POA: Diagnosis not present

## 2018-01-20 DIAGNOSIS — J340 Abscess, furuncle and carbuncle of nose: Secondary | ICD-10-CM | POA: Diagnosis not present

## 2018-01-31 DIAGNOSIS — K91850 Pouchitis: Secondary | ICD-10-CM | POA: Diagnosis not present

## 2018-01-31 DIAGNOSIS — Z9049 Acquired absence of other specified parts of digestive tract: Secondary | ICD-10-CM | POA: Diagnosis not present

## 2018-01-31 DIAGNOSIS — K529 Noninfective gastroenteritis and colitis, unspecified: Secondary | ICD-10-CM | POA: Diagnosis not present

## 2018-01-31 DIAGNOSIS — Z8719 Personal history of other diseases of the digestive system: Secondary | ICD-10-CM | POA: Diagnosis not present

## 2018-01-31 DIAGNOSIS — K5100 Ulcerative (chronic) pancolitis without complications: Secondary | ICD-10-CM | POA: Diagnosis not present

## 2018-01-31 DIAGNOSIS — Z79899 Other long term (current) drug therapy: Secondary | ICD-10-CM | POA: Diagnosis not present

## 2018-02-28 DIAGNOSIS — K5100 Ulcerative (chronic) pancolitis without complications: Secondary | ICD-10-CM | POA: Diagnosis not present

## 2018-02-28 DIAGNOSIS — K91850 Pouchitis: Secondary | ICD-10-CM | POA: Diagnosis not present

## 2018-04-03 DIAGNOSIS — M3501 Sicca syndrome with keratoconjunctivitis: Secondary | ICD-10-CM | POA: Diagnosis not present

## 2018-04-08 DIAGNOSIS — D485 Neoplasm of uncertain behavior of skin: Secondary | ICD-10-CM | POA: Diagnosis not present

## 2018-04-15 DIAGNOSIS — L720 Epidermal cyst: Secondary | ICD-10-CM | POA: Diagnosis not present

## 2018-04-15 DIAGNOSIS — L920 Granuloma annulare: Secondary | ICD-10-CM | POA: Diagnosis not present

## 2018-04-17 ENCOUNTER — Other Ambulatory Visit: Payer: Self-pay | Admitting: Physician Assistant

## 2018-04-17 DIAGNOSIS — F5101 Primary insomnia: Secondary | ICD-10-CM

## 2018-05-26 ENCOUNTER — Inpatient Hospital Stay
Admission: EM | Admit: 2018-05-26 | Discharge: 2018-05-29 | DRG: 872 | Disposition: A | Discharge status: Home / Self Care | Payer: BLUE CROSS/BLUE SHIELD | Attending: Internal Medicine | Admitting: Internal Medicine

## 2018-05-26 ENCOUNTER — Emergency Department: Payer: BLUE CROSS/BLUE SHIELD

## 2018-05-26 DIAGNOSIS — Z883 Allergy status to other anti-infective agents status: Secondary | ICD-10-CM

## 2018-05-26 DIAGNOSIS — J45990 Exercise induced bronchospasm: Secondary | ICD-10-CM | POA: Diagnosis present

## 2018-05-26 DIAGNOSIS — R197 Diarrhea, unspecified: Secondary | ICD-10-CM | POA: Diagnosis not present

## 2018-05-26 DIAGNOSIS — Z9049 Acquired absence of other specified parts of digestive tract: Secondary | ICD-10-CM

## 2018-05-26 DIAGNOSIS — R52 Pain, unspecified: Secondary | ICD-10-CM

## 2018-05-26 DIAGNOSIS — Z90710 Acquired absence of both cervix and uterus: Secondary | ICD-10-CM | POA: Diagnosis not present

## 2018-05-26 DIAGNOSIS — K91850 Pouchitis: Secondary | ICD-10-CM | POA: Diagnosis present

## 2018-05-26 DIAGNOSIS — Z79890 Hormone replacement therapy (postmenopausal): Secondary | ICD-10-CM | POA: Diagnosis not present

## 2018-05-26 DIAGNOSIS — Z7951 Long term (current) use of inhaled steroids: Secondary | ICD-10-CM | POA: Diagnosis not present

## 2018-05-26 DIAGNOSIS — R109 Unspecified abdominal pain: Secondary | ICD-10-CM | POA: Diagnosis not present

## 2018-05-26 DIAGNOSIS — F419 Anxiety disorder, unspecified: Secondary | ICD-10-CM | POA: Diagnosis not present

## 2018-05-26 DIAGNOSIS — E876 Hypokalemia: Secondary | ICD-10-CM | POA: Diagnosis not present

## 2018-05-26 DIAGNOSIS — Z803 Family history of malignant neoplasm of breast: Secondary | ICD-10-CM | POA: Diagnosis not present

## 2018-05-26 DIAGNOSIS — Z79899 Other long term (current) drug therapy: Secondary | ICD-10-CM | POA: Diagnosis not present

## 2018-05-26 DIAGNOSIS — J45909 Unspecified asthma, uncomplicated: Secondary | ICD-10-CM | POA: Diagnosis not present

## 2018-05-26 DIAGNOSIS — J029 Acute pharyngitis, unspecified: Secondary | ICD-10-CM | POA: Diagnosis not present

## 2018-05-26 DIAGNOSIS — Z885 Allergy status to narcotic agent status: Secondary | ICD-10-CM | POA: Diagnosis not present

## 2018-05-26 DIAGNOSIS — E039 Hypothyroidism, unspecified: Secondary | ICD-10-CM | POA: Diagnosis present

## 2018-05-26 DIAGNOSIS — H5789 Other specified disorders of eye and adnexa: Secondary | ICD-10-CM | POA: Diagnosis not present

## 2018-05-26 DIAGNOSIS — R509 Fever, unspecified: Secondary | ICD-10-CM | POA: Diagnosis not present

## 2018-05-26 DIAGNOSIS — F329 Major depressive disorder, single episode, unspecified: Secondary | ICD-10-CM | POA: Diagnosis not present

## 2018-05-26 DIAGNOSIS — R002 Palpitations: Secondary | ICD-10-CM | POA: Diagnosis not present

## 2018-05-26 DIAGNOSIS — R5381 Other malaise: Secondary | ICD-10-CM | POA: Diagnosis not present

## 2018-05-26 DIAGNOSIS — R609 Edema, unspecified: Secondary | ICD-10-CM | POA: Diagnosis not present

## 2018-05-26 DIAGNOSIS — Z888 Allergy status to other drugs, medicaments and biological substances status: Secondary | ICD-10-CM

## 2018-05-26 DIAGNOSIS — R000 Tachycardia, unspecified: Secondary | ICD-10-CM | POA: Diagnosis not present

## 2018-05-26 DIAGNOSIS — A419 Sepsis, unspecified organism: Secondary | ICD-10-CM | POA: Diagnosis not present

## 2018-05-26 DIAGNOSIS — R112 Nausea with vomiting, unspecified: Secondary | ICD-10-CM | POA: Diagnosis not present

## 2018-05-26 DIAGNOSIS — Z882 Allergy status to sulfonamides status: Secondary | ICD-10-CM | POA: Diagnosis not present

## 2018-05-26 DIAGNOSIS — R0602 Shortness of breath: Secondary | ICD-10-CM | POA: Diagnosis not present

## 2018-05-26 DIAGNOSIS — R51 Headache: Secondary | ICD-10-CM | POA: Diagnosis not present

## 2018-05-26 DIAGNOSIS — R42 Dizziness and giddiness: Secondary | ICD-10-CM | POA: Diagnosis not present

## 2018-05-26 DIAGNOSIS — R5383 Other fatigue: Secondary | ICD-10-CM | POA: Diagnosis not present

## 2018-05-26 MED ORDER — METRONIDAZOLE 500 MG PO TABS
250.0000 mg | ORAL_TABLET | ORAL | Status: AC
  Administered 2018-05-26: 250 mg via ORAL
  Filled 2018-05-26: qty 1

## 2018-05-26 MED ORDER — SODIUM CHLORIDE 0.9 % IV BOLUS (SEPSIS)
750.0000 mL | Freq: Once | INTRAVENOUS | Status: AC
  Administered 2018-05-27: 750 mL via INTRAVENOUS

## 2018-05-26 MED ORDER — SODIUM CHLORIDE 0.9 % IV BOLUS
1000.0000 mL | Freq: Once | INTRAVENOUS | Status: AC
  Administered 2018-05-26: 1000 mL via INTRAVENOUS

## 2018-05-26 MED ORDER — METOCLOPRAMIDE HCL 5 MG/ML IJ SOLN
10.0000 mg | Freq: Once | INTRAMUSCULAR | Status: DC
  Filled 2018-05-26: qty 2

## 2018-05-26 MED ORDER — DIPHENHYDRAMINE HCL 50 MG/ML IJ SOLN
25.0000 mg | Freq: Once | INTRAMUSCULAR | Status: DC
  Filled 2018-05-26: qty 1

## 2018-05-26 NOTE — Progress Notes (Signed)
CODE SEPSIS - PHARMACY COMMUNICATION  **Broad Spectrum Antibiotics should be administered within 1 hour of Sepsis diagnosis**  Time Code Sepsis Called/Page Received: 2325  Antibiotics Ordered: metronidazole  Time of 1st antibiotic administration: 2331  Additional action taken by pharmacy:   If necessary, Name of Provider/Nurse Contacted:     Tobie Lords ,PharmD Clinical Pharmacist  05/26/2018  11:40 PM

## 2018-05-26 NOTE — ED Triage Notes (Signed)
Pt arrived from home via EMS with complaints of difficulty breathing. Pt was found supine in bathroom floor. Pt has just taken a hot bath when she got out of the bathtub and she felt as if she couldn't breathe. Pt appears to be extremely anxious and fidgeting. Pt is alert and oriented x 4. Pt has a GI pouch with extensive GI Hx. VS per EMS BP-125/82 temp-102.9 HR-112 O2sat-98%RA Pt states she has abdominal and back pain. Dr. Karma Greaser is at the bedside.

## 2018-05-26 NOTE — ED Provider Notes (Signed)
[facility] Emergency Department Provider Note  ____________________________________________   First MD Initiated Contact with Patient 05/26/18 2311     (approximate)  I have reviewed the triage vital signs and the nursing notes.   HISTORY  Chief Complaint No chief complaint on file.    HPI Mariah Nguyen is a 47 y.o. 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  She presents tonight by EMS for fever, abdominal pain, shortness of breath, and diarrhea.Very complicated history best explained in [facility] notes (Dr. 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  She is on frequent (if not chronic maintainence) Cipro and sometimes Flagyl.  She is very regimented with her medications as well as her diet, and she feels strongly about sticking with a low FODMAP diet (which is beneficial for IBD patients).  She reports many food and medication sensitivities.    She reports traveling recently to [place] and having issues with her bowels since then.  She feels that being delayed on getting her Flagyl has led to her current issues.  She has had "bronchitis" recently with some difficulty breathing and cough, then developed her current severe generalized abdominal pain which feels similar to her prior episodes.  Of note, however, tonight she has a fever to 102 which is abnormal for her, even during her episodes of acute GI issues.  No diarrhea greater than baseline, although increased bowel urgency (common for her).    +fever.  Denies chest pain.  Recent SOB and cough.  Nothing in particular makes  abd pain better nor worse.  Patient reports she gets dehydrated easier with low potassium and would like something to drink, especially cranberry juice (denies nausea/vomiting).  Extremely anxious, cannot  remain still.  Past Medical History:  Diagnosis Date  . Exercise-induced asthma   . Ulcerative colitis ([place]) 1996    Patient Active Problem List   Diagnosis Date Noted  . Malnutrition ([place]) 09/30/2017  . 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 Shoulder pain 10/07/2015  . Subclinical hypothyroidism 09/27/2015  . Asthma 06/09/2015  . Anxiety 06/09/2015  . Clinical depression 06/09/2015  . Cannot sleep 06/09/2015  . Migraine 06/09/2015  . 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 Exercise-induced asthma with acute exacerbation 06/09/2015  . Beat, premature ventricular 01/21/2014  . Bowel disease, inflammatory 10/19/2013  . Family history of breast cancer 02/19/2013  . Chronic ulcerative enterocolitis ([place]) 01/08/2013  . Allergy to environmental factors 07/01/2012  . Ileal pouchitis ([place]) 06/22/2011    Past Surgical History:  Procedure Laterality Date  . CESAREAN SECTION  2002  . PARTIAL HYSTERECTOMY  08/2007  . Right eye surgery  07/15/12   [facility]  . Stripping of varicose veins in legs    . TONSILLECTOMY AND ADENOIDECTOMY  01/2005  . Total proctocolectomy with J-pouch  1997   History of recurrent pouchitis    Prior to Admission medications   Medication Sig Start Date End Date Taking? Authorizing Provider  acetaminophen (TYLENOL) 325 MG tablet Take 650 mg by mouth every 6 (six) hours as needed for mild pain.   Yes [provider]  ciprofloxacin (CIPRO) 500 MG tablet Take 500 mg by mouth 2 (two) times daily.    Yes [provider]  cycloSPORINE (RESTASIS) 0.05 % ophthalmic emulsion Place 1 drop into both eyes 2 (two) times daily.  11/06/12  Yes [provider]  levothyroxine (SYNTHROID, LEVOTHROID) 50 MCG tablet Take 1 tablet (50 mcg total) by mouth daily before breakfast. 11/26/17  Yes Burnette, Clearnce Sorrel, PA-C  metroNIDAZOLE (FLAGYL) 500 MG tablet Take 250 mg by mouth 3 (three) times daily. 05/22/18  Yes [provider]  montelukast (SINGULAIR)  10 MG tablet TAKE 1 TABLET EVERY DAY AS DIRECTED 09/30/17  Yes Fenton Malling M, PA-C  sertraline (ZOLOFT) 25 MG tablet Take 25 mg by mouth 3 (three) times daily. 05/04/18  Yes [provider]  traZODone (DESYREL) 100 MG tablet TAKE 2 TABLETS (200 MG TOTAL) BY MOUTH AT BEDTIME. 04/17/18  Yes Burnette, Clearnce Sorrel, PA-C  Vitamin D, Ergocalciferol, (DRISDOL) 50000 units CAPS capsule Take 1 capsule (50,000 Units total) by mouth every 7 (seven) days. 09/30/17  Yes Mar Daring, PA-C  albuterol (VENTOLIN HFA) 108 (90 BASE) MCG/ACT inhaler Inhale 2 puffs into the lungs every 6 (six) hours as needed for wheezing or shortness of breath. 06/09/15   Chrismon, Vickki Muff, PA  ALPRAZolam (XANAX) 0.5 MG tablet Take 1 tablet (0.5 mg total) by mouth 2 (two) times daily as needed for anxiety. 09/30/17   Mar Daring, PA-C  beclomethasone (QVAR) 40 MCG/ACT inhaler Inhale 2 puffs into the lungs 2 (two) times daily as needed. 06/09/15   Chrismon, Vickki Muff, PA  busPIRone (BUSPAR) 15 MG tablet Take by mouth. Reported on 09/27/2015    [provider]  cetirizine (ZYRTEC ALLERGY) 10 MG tablet Take by mouth.    [provider]  Cranberry Fruit 405 MG CAPS Take by mouth.    [provider]  cyclobenzaprine (FLEXERIL) 10 MG tablet Take 1 tablet (10 mg total) by mouth at bedtime. 11/01/16   Mar Daring, PA-C  diphenoxylate-atropine (LOMOTIL) 2.5-0.025 MG tablet Take by mouth.    [provider]  EPINEPHrine (EPIPEN 2-PAK) 0.3 mg/0.3 mL IJ SOAJ injection EPIPEN 2-PAK, 0.3MG /0.3ML (Injection Solution Auto-injector)  1 (one) Soln Auto-inj as directed for 0 days  Quantity: 1;  Refills: 0   Ordered :13-Jan-2015  Margarita Rana MD;  Started 13-Jan-2015 Active Comments: Needs ov scheduled. Thanks- Dr. Jerilynn Mages. 01/13/15   [provider]  NYSTATIN powder APPLY TO AFFECTED AREA TWICE A DAY 05/01/17   Mar Daring, PA-C  promethazine (PHENERGAN) 25 MG tablet Take 1  tablet (25 mg total) by mouth every 4 (four) hours as needed. 09/30/17   Mar Daring, PA-C  terconazole (TERAZOL 3) 0.8 % vaginal cream Insert vaginally once weekly Patient not taking: Reported on 09/30/2017 06/05/16   Mar Daring, PA-C    Allergies Bentyl  [dicyclomine]; Flagyl [metronidazole]; Meperidine; Tinidazole; Demerol [meperidine hcl]; Povidone iodine; and Sulfa antibiotics  History reviewed. No pertinent family history.  Social History Social History   Tobacco Use  . Smoking status: Never Smoker  . Smokeless tobacco: Never Used  Substance Use Topics  . Alcohol use: Yes    Alcohol/week: 0.0 standard drinks    Comment: Occasionally  . Drug use: No    Review of Systems Constitutional: +Fever Eyes: No visual changes. ENT: No sore throat. Cardiovascular: Denies chest pain. Respiratory: recent SOB/cough Gastrointestinal: Generalized abdominal pain.  No nausea, no vomiting.  Chronic diarrhea and frequent stools. Genitourinary: Negative for dysuria. Musculoskeletal: Negative for neck pain.  Low back pain. Integumentary: Negative for rash. Neurological: Negative for headaches, focal weakness or numbness.   ____________________________________________   PHYSICAL EXAM:  VITAL SIGNS: ED Triage Vitals  Enc Vitals Group  BP 05/26/18 2312 114/80     Pulse Rate 05/26/18 2312 (!) 105     Resp 05/26/18 2312 (!) 28     Temp 05/26/18 2312 (!) 102.1 F (38.9 C)     Temp Source 05/26/18 2312 Oral     SpO2 05/26/18 2312 99 %     Weight 05/26/18 2316 56.7 kg (125 lb)     Height 05/26/18 2316 1.6 m (5\' 3" )     Head Circumference --      Peak Flow --      Pain Score 05/26/18 2311 8     Pain Loc --      Pain Edu? --      Excl. in [place]? --     Constitutional: Alert and oriented. Extremely anxious and uncomfortable appearance. Eyes: Conjunctivae are normal.  Head: Atraumatic. Nose: No congestion/rhinnorhea. Mouth/Throat: Mucous membranes are moist. Neck:  No stridor.  No meningeal signs.   Cardiovascular: Sinus tachycardia, regular rhythm. Good peripheral circulation. Grossly normal heart sounds. Respiratory: Normal respiratory effort.  No retractions. Lungs CTAB. Gastrointestinal: Soft and non-distended.  Diffuse mild tenderness to palpation throughout. Musculoskeletal: No lower extremity tenderness nor edema. No gross deformities of extremities. Neurologic:  Normal speech and language. No gross focal neurologic deficits are appreciated.  Skin:  Skin is warm, dry and intact. No rash noted. Psychiatric: Mood and affect are very anxious, pressured speech.  ____________________________________________   LABS (all labs ordered are listed, but only abnormal results are displayed)  Labs Reviewed  COMPREHENSIVE METABOLIC PANEL - Abnormal; Notable for the following components:      Result Value   Potassium 3.4 (*)    All other components within normal limits  LACTIC ACID, PLASMA - Abnormal; Notable for the following components:   Lactic Acid, Venous 2.2 (*)    All other components within normal limits  CBC WITH DIFFERENTIAL/PLATELET - Abnormal; Notable for the following components:   Neutro Abs 7.0 (*)    Lymphs Abs 0.6 (*)    All other components within normal limits  URINALYSIS, ROUTINE W REFLEX MICROSCOPIC - Abnormal; Notable for the following components:   Color, Urine YELLOW (*)    APPearance CLEAR (*)    All other components within normal limits  C DIFFICILE QUICK SCREEN W PCR REFLEX  GASTROINTESTINAL PANEL BY PCR, STOOL (REPLACES STOOL CULTURE)  CULTURE, BLOOD (ROUTINE X 2)  CULTURE, BLOOD (ROUTINE X 2)  URINE CULTURE  LIPASE, BLOOD  LACTIC ACID, PLASMA  TROPONIN I  PROCALCITONIN  PROTIME-INR  BRAIN NATRIURETIC PEPTIDE  PREGNANCY, URINE  MONONUCLEOSIS SCREEN   ____________________________________________  EKG  ED ECG REPORT I, Hinda Kehr, the attending physician, personally viewed and interpreted this ECG.  Date:  05/26/2018 EKG Time: 23:16 Rate: 114 Rhythm: sinus tachycardia QRS Axis: normal Intervals: normal ST/T Wave abnormalities: no acute abnormalities Narrative Interpretation: no evidence of acute ischemia   ____________________________________________  RADIOLOGY I, Hinda Kehr, personally viewed and evaluated these images (plain radiographs) as part of my medical decision making, as well as reviewing the written report by the radiologist.  ED MD interpretation:  No acute disease on CXR.  Official radiology report(s): Ct Abdomen Pelvis Wo Contrast  Result Date: 05/27/2018 CLINICAL DATA:  47 y/o F; found on floor with difficulty breathing. Abdominal and back pain. EXAM: CT ABDOMEN AND PELVIS WITHOUT CONTRAST TECHNIQUE: Multidetector CT imaging of the abdomen and pelvis was performed following the standard protocol without IV contrast. COMPARISON:  None. FINDINGS: Lower chest: No acute abnormality. Hepatobiliary: No  focal liver abnormality is seen. No gallstones, gallbladder wall thickening, or biliary dilatation. Pancreas: Unremarkable. No pancreatic ductal dilatation or surrounding inflammatory changes. Spleen: The spleen measures 9.8 x 5.0 x 11.8 cm (volume = 300 cm^3). Adrenals/Urinary Tract: Adrenal glands are unremarkable. Kidneys are normal, without renal calculi, focal lesion, or hydronephrosis. Bladder is unremarkable. Stomach/Bowel: Postsurgical changes related to proctectomy and J-pouch. No findings of bowel obstruction. Mild wall thickening of the GB pouch. Vascular/Lymphatic: No significant vascular findings are present. No enlarged abdominal or pelvic lymph nodes. Reproductive: Status post hysterectomy. No adnexal masses. Other: No abdominal wall hernia or abnormality. No abdominopelvic ascites. Musculoskeletal: No fracture is seen. IMPRESSION: 1. Prostate ectomy with J-pouch. Mild wall thickening of the J-pouch may reflect infection or inflammation. 2. Borderline splenomegaly.  Electronically Signed   By: Kristine Garbe M.D.   On: 05/27/2018 03:26   Dg Chest Port 1 View  Result Date: 05/26/2018 CLINICAL DATA:  47 year old female with sepsis. EXAM: PORTABLE CHEST 1 VIEW COMPARISON:  None. FINDINGS: The heart size and mediastinal contours are within normal limits. Both lungs are clear. The visualized skeletal structures are unremarkable. IMPRESSION: No active disease. Electronically Signed   By: Anner Crete M.D.   On: 05/26/2018 23:52    ____________________________________________   PROCEDURES  Critical Care performed: Yes, see critical care procedure note(s)   Procedure(s) performed:   .Critical Care Performed by: Hinda Kehr, MD Authorized by: Hinda Kehr, MD   Critical care provider statement:    Critical care time (minutes):  45   Critical care time was exclusive of:  Separately billable procedures and treating other patients   Critical care was necessary to treat or prevent imminent or life-threatening deterioration of the following conditions:  Sepsis   Critical care was time spent personally by me on the following activities:  Development of treatment plan with patient or surrogate, discussions with consultants, evaluation of patient's response to treatment, examination of patient, obtaining history from patient or surrogate, ordering and performing treatments and interventions, ordering and review of laboratory studies, ordering and review of radiographic studies, pulse oximetry, re-evaluation of patient's condition and review of old charts     ____________________________________________   INITIAL IMPRESSION / [place] / ED COURSE  As part of my medical decision making, I reviewed the following data within the electronic MEDICAL RECORD NUMBER History obtained from family, Nursing notes reviewed and incorporated, Labs reviewed , EKG interpreted , Old chart reviewed, Radiograph reviewed , Notes from prior ED visits and [place]  Controlled Substance Database    DiffDx includes but is not limited to acute on chronic pouchitis, other intraabdominal infection or abscess, UTI, PNA, viral infection.  Meets sepsis criteria, initiating sepsis protocol, but holding on specific abx until I get more information.  Febrile to 102, tachy, tachypnea, but also very anxious.  Ordered 61mL/kg NS bolus.  Patient insistent that she receive a dose of metronidazole 250 mg PO because it is her schedule medication, and I think there is no reason to NOT give it.  Also giving Reglan 10 mg IV and Benadryl 25 mg IV to help with pain/nausea and hopefully it will help some with anxiety as well.  Anticipate CT scan given complicated history and sepsis criteria with probable intraabdominal infection.  Clinical Course as of May 27 414  Tue May 27, 2018  0051 Procalcitonin: 0.27 [CF]  0051 The patient is noted to have a lactate>2. With the current information available to me, including the normal procalcitonin, I  do not believe the patient has severe sepsis. The lactate>2 is related to decreased volume status and tachypnea secondary to anxiety.   Lactic Acid, Venous(!!): 2.2 [CF]  0134 Of note, I have discussed with the patient and her husband the need or recommendation for empiric broad-spectrum antibiotics for a presumed diagnosis of sepsis.  The patient is quite adamant that she does not want any antibiotics other than the oral Flagyl which she requested earlier.  I explained the reasoning behind the empiric antibiotics as well as her somewhat mixed results suggestive of sepsis including a normal procalcitonin, and elevated lactic acid which could be the result of her respiratory status, no leukocytosis, but fever and tachycardia and tachypnea in the setting of acute anxiety as well.  She continues to feel strongly that she does not want broad-spectrum empiric antibiotics and I think that is appropriate even though it is not consistent with the typical  sepsis bundle.  I attempted to order a CT scan with oral and IV contrast but the patient refuses both forms of contrast citing an allergy to iodine.  I think that there is little benefit to doing the prep with steroids and Benadryl, particularly because the patient also refuses Benadryl stating that she has a "very rare" allergy to Benadryl that causes insomnia for 4 to 5 days.  I will proceed with a noncontrast CT scan of the abdomen and pelvis which hopefully will provide at least some reassurance.   [CF]  0137 UA within normal limits  Urinalysis, Routine w reflex microscopic(!) [CF]  0138 no leukocytosis  WBC: 7.9 [CF]  0138 essentially normal CMP other than very slight  Comprehensive metabolic panel(!) [CF]  2836 The patient says she needs more pain medicine before she can go to CT.  I have given another dose of morphine 4 mg IV.  Of note her repeat lactic acid is come down to 1.9 after IV fluids.   [CF]  0225 Lactic Acid, Venous: 1.9 [CF]  0225 negative c. diff testing  C difficile quick scan w PCR reflex [CF]  0412 CT scan indicates probable pouchitis (inflammation versus infection) and borderline splenomegaly.  I added on a Monospot.  The patient is reporting severe pain again I provided a third dose of morphine 4 mill grams IV in addition to a milligram of Ativan that she received earlier.  She still has a fever greater than 101.  I explained at this point that we need to treat empirically for the pouchitis and this time she reluctantly agreed.  I have ordered ceftriaxone 2 g IV and metronidazole 250 mg IV (to make up the difference between the goal of 500 mg and the dose of 250 p.o. that she took earlier).  This recommendation was based on intra-abdominal infection in the ED sepsis order set.The patient agrees to admission at this time for pouchitis, fever, meeting sepsis criteria, and intractable pain.  The husband agrees as well.  I discussed the case with Dr. Marcille Blanco with the hospitalist  service who will admit.  CT ABDOMEN PELVIS WO CONTRAST [CF]    Clinical Course User Index [CF] Hinda Kehr, MD    ____________________________________________  FINAL CLINICAL IMPRESSION(S) / ED DIAGNOSES  Final diagnoses:  Pouchitis ([place])  Fever, unspecified fever cause  Intractable pain  Sepsis, due to unspecified organism [facility])     MEDICATIONS GIVEN DURING THIS VISIT:  Medications  LORazepam (ATIVAN) injection 1 mg (0.5 mg Intravenous Given 05/27/18 0125)  cefTRIAXone (ROCEPHIN) 2 g in sodium  chloride 0.9 % 100 mL IVPB (has no administration in time range)  metroNIDAZOLE (FLAGYL) IVPB 250 mg (has no administration in time range)  sodium chloride 0.9 % bolus 1,000 mL (0 mLs Intravenous Stopped 05/27/18 0118)  metroNIDAZOLE (FLAGYL) tablet 250 mg (250 mg Oral Given 05/26/18 2331)  sodium chloride 0.9 % bolus 750 mL (0 mLs Intravenous Stopped 05/27/18 0346)  morphine 4 MG/ML injection 4 mg (4 mg Intravenous Given 05/27/18 0049)  ondansetron (ZOFRAN) injection 4 mg (4 mg Intravenous Given 05/27/18 0049)  morphine 4 MG/ML injection 4 mg (4 mg Intravenous Given 05/27/18 0215)  acetaminophen (TYLENOL) tablet 1,000 mg (1,000 mg Oral Given 05/27/18 0344)  traZODone (DESYREL) tablet 100 mg (100 mg Oral Given 05/27/18 0344)  morphine 4 MG/ML injection 4 mg (4 mg Intravenous Given 05/27/18 0356)     ED Discharge Orders    None       Note:  This document was prepared using Dragon voice recognition software and may include unintentional dictation errors.    Hinda Kehr, MD 05/27/18 [phone]

## 2018-05-27 ENCOUNTER — Emergency Department: Payer: BLUE CROSS/BLUE SHIELD

## 2018-05-27 ENCOUNTER — Other Ambulatory Visit: Payer: Self-pay

## 2018-05-27 DIAGNOSIS — F329 Major depressive disorder, single episode, unspecified: Secondary | ICD-10-CM | POA: Diagnosis present

## 2018-05-27 DIAGNOSIS — R109 Unspecified abdominal pain: Secondary | ICD-10-CM | POA: Diagnosis not present

## 2018-05-27 DIAGNOSIS — A419 Sepsis, unspecified organism: Secondary | ICD-10-CM | POA: Diagnosis not present

## 2018-05-27 DIAGNOSIS — J45909 Unspecified asthma, uncomplicated: Secondary | ICD-10-CM | POA: Diagnosis not present

## 2018-05-27 DIAGNOSIS — Z7951 Long term (current) use of inhaled steroids: Secondary | ICD-10-CM | POA: Diagnosis not present

## 2018-05-27 DIAGNOSIS — R509 Fever, unspecified: Secondary | ICD-10-CM | POA: Diagnosis not present

## 2018-05-27 DIAGNOSIS — Z79890 Hormone replacement therapy (postmenopausal): Secondary | ICD-10-CM | POA: Diagnosis not present

## 2018-05-27 DIAGNOSIS — E039 Hypothyroidism, unspecified: Secondary | ICD-10-CM | POA: Diagnosis present

## 2018-05-27 DIAGNOSIS — J029 Acute pharyngitis, unspecified: Secondary | ICD-10-CM | POA: Diagnosis not present

## 2018-05-27 DIAGNOSIS — Z888 Allergy status to other drugs, medicaments and biological substances status: Secondary | ICD-10-CM | POA: Diagnosis not present

## 2018-05-27 DIAGNOSIS — Z90710 Acquired absence of both cervix and uterus: Secondary | ICD-10-CM | POA: Diagnosis not present

## 2018-05-27 DIAGNOSIS — Z882 Allergy status to sulfonamides status: Secondary | ICD-10-CM | POA: Diagnosis not present

## 2018-05-27 DIAGNOSIS — R51 Headache: Secondary | ICD-10-CM | POA: Diagnosis not present

## 2018-05-27 DIAGNOSIS — E876 Hypokalemia: Secondary | ICD-10-CM | POA: Diagnosis not present

## 2018-05-27 DIAGNOSIS — R5383 Other fatigue: Secondary | ICD-10-CM | POA: Diagnosis not present

## 2018-05-27 DIAGNOSIS — Z9049 Acquired absence of other specified parts of digestive tract: Secondary | ICD-10-CM | POA: Diagnosis not present

## 2018-05-27 DIAGNOSIS — K91850 Pouchitis: Secondary | ICD-10-CM | POA: Diagnosis present

## 2018-05-27 DIAGNOSIS — Z803 Family history of malignant neoplasm of breast: Secondary | ICD-10-CM | POA: Diagnosis not present

## 2018-05-27 DIAGNOSIS — Z883 Allergy status to other anti-infective agents status: Secondary | ICD-10-CM | POA: Diagnosis not present

## 2018-05-27 DIAGNOSIS — Z79899 Other long term (current) drug therapy: Secondary | ICD-10-CM | POA: Diagnosis not present

## 2018-05-27 DIAGNOSIS — R609 Edema, unspecified: Secondary | ICD-10-CM | POA: Diagnosis not present

## 2018-05-27 DIAGNOSIS — R5381 Other malaise: Secondary | ICD-10-CM | POA: Diagnosis not present

## 2018-05-27 DIAGNOSIS — Z885 Allergy status to narcotic agent status: Secondary | ICD-10-CM | POA: Diagnosis not present

## 2018-05-27 DIAGNOSIS — J45990 Exercise induced bronchospasm: Secondary | ICD-10-CM | POA: Diagnosis present

## 2018-05-27 DIAGNOSIS — F419 Anxiety disorder, unspecified: Secondary | ICD-10-CM | POA: Diagnosis present

## 2018-05-27 LAB — PROTIME-INR
INR: 1.13
Prothrombin Time: 14.4 seconds (ref 11.4–15.2)

## 2018-05-27 LAB — GASTROINTESTINAL PANEL BY PCR, STOOL (REPLACES STOOL CULTURE)
ADENOVIRUS F40/41: NOT DETECTED
Astrovirus: NOT DETECTED
CAMPYLOBACTER SPECIES: NOT DETECTED
CRYPTOSPORIDIUM: NOT DETECTED
Cyclospora cayetanensis: NOT DETECTED
ENTEROPATHOGENIC E COLI (EPEC): NOT DETECTED
Entamoeba histolytica: NOT DETECTED
Enteroaggregative E coli (EAEC): NOT DETECTED
Enterotoxigenic E coli (ETEC): NOT DETECTED
Giardia lamblia: NOT DETECTED
Norovirus GI/GII: NOT DETECTED
Plesimonas shigelloides: NOT DETECTED
Rotavirus A: NOT DETECTED
SHIGELLA/ENTEROINVASIVE E COLI (EIEC): NOT DETECTED
Salmonella species: NOT DETECTED
Sapovirus (I, II, IV, and V): NOT DETECTED
Shiga like toxin producing E coli (STEC): NOT DETECTED
Vibrio cholerae: NOT DETECTED
Vibrio species: NOT DETECTED
YERSINIA ENTEROCOLITICA: NOT DETECTED

## 2018-05-27 LAB — CBC WITH DIFFERENTIAL/PLATELET
BASOS PCT: 1 %
Basophils Absolute: 0.1 10*3/uL (ref 0–0.1)
EOS ABS: 0 10*3/uL (ref 0–0.7)
Eosinophils Relative: 0 %
HCT: 39.6 % (ref 35.0–47.0)
HEMOGLOBIN: 14 g/dL (ref 12.0–16.0)
Lymphocytes Relative: 7 %
Lymphs Abs: 0.6 10*3/uL — ABNORMAL LOW (ref 1.0–3.6)
MCH: 29.6 pg (ref 26.0–34.0)
MCHC: 35.4 g/dL (ref 32.0–36.0)
MCV: 83.6 fL (ref 80.0–100.0)
MONOS PCT: 4 %
Monocytes Absolute: 0.3 10*3/uL (ref 0.2–0.9)
NEUTROS PCT: 88 %
Neutro Abs: 7 10*3/uL — ABNORMAL HIGH (ref 1.4–6.5)
Platelets: 214 10*3/uL (ref 150–440)
RBC: 4.74 MIL/uL (ref 3.80–5.20)
RDW: 13.3 % (ref 11.5–14.5)
WBC: 7.9 10*3/uL (ref 3.6–11.0)

## 2018-05-27 LAB — TROPONIN I

## 2018-05-27 LAB — C DIFFICILE QUICK SCREEN W PCR REFLEX
C DIFFICILE (CDIFF) INTERP: NOT DETECTED
C Diff antigen: NEGATIVE
C Diff toxin: NEGATIVE

## 2018-05-27 LAB — COMPREHENSIVE METABOLIC PANEL
ALT: 25 U/L (ref 0–44)
ANION GAP: 7 (ref 5–15)
AST: 40 U/L (ref 15–41)
Albumin: 3.9 g/dL (ref 3.5–5.0)
Alkaline Phosphatase: 89 U/L (ref 38–126)
BUN: 10 mg/dL (ref 6–20)
CHLORIDE: 107 mmol/L (ref 98–111)
CO2: 23 mmol/L (ref 22–32)
CREATININE: 0.93 mg/dL (ref 0.44–1.00)
Calcium: 9 mg/dL (ref 8.9–10.3)
Glucose, Bld: 91 mg/dL (ref 70–99)
POTASSIUM: 3.4 mmol/L — AB (ref 3.5–5.1)
Sodium: 137 mmol/L (ref 135–145)
Total Bilirubin: 0.5 mg/dL (ref 0.3–1.2)
Total Protein: 7.1 g/dL (ref 6.5–8.1)

## 2018-05-27 LAB — URINALYSIS, ROUTINE W REFLEX MICROSCOPIC
BILIRUBIN URINE: NEGATIVE
Glucose, UA: NEGATIVE mg/dL
Hgb urine dipstick: NEGATIVE
Ketones, ur: NEGATIVE mg/dL
Leukocytes, UA: NEGATIVE
NITRITE: NEGATIVE
PROTEIN: NEGATIVE mg/dL
SPECIFIC GRAVITY, URINE: 1.014 (ref 1.005–1.030)
pH: 8 (ref 5.0–8.0)

## 2018-05-27 LAB — BRAIN NATRIURETIC PEPTIDE: B NATRIURETIC PEPTIDE 5: 15 pg/mL (ref 0.0–100.0)

## 2018-05-27 LAB — LACTIC ACID, PLASMA
LACTIC ACID, VENOUS: 2.2 mmol/L — AB (ref 0.5–1.9)
Lactic Acid, Venous: 1.9 mmol/L (ref 0.5–1.9)

## 2018-05-27 LAB — TSH: TSH: 0.324 u[IU]/mL — ABNORMAL LOW (ref 0.350–4.500)

## 2018-05-27 LAB — PREGNANCY, URINE: Preg Test, Ur: NEGATIVE

## 2018-05-27 LAB — MONONUCLEOSIS SCREEN: MONO SCREEN: NEGATIVE

## 2018-05-27 LAB — PROCALCITONIN: PROCALCITONIN: 0.27 ng/mL

## 2018-05-27 LAB — LIPASE, BLOOD: LIPASE: 34 U/L (ref 11–51)

## 2018-05-27 MED ORDER — LORAZEPAM 2 MG/ML IJ SOLN
INTRAMUSCULAR | Status: AC
  Filled 2018-05-27: qty 1

## 2018-05-27 MED ORDER — MORPHINE SULFATE (PF) 4 MG/ML IV SOLN
4.0000 mg | Freq: Once | INTRAVENOUS | Status: AC
  Administered 2018-05-27: 4 mg via INTRAVENOUS
  Filled 2018-05-27: qty 1

## 2018-05-27 MED ORDER — ONDANSETRON HCL 4 MG PO TABS: 4.0000 mg | ORAL_TABLET | Freq: Four times a day (QID) | ORAL | Status: DC | PRN

## 2018-05-27 MED ORDER — CIPROFLOXACIN HCL 500 MG PO TABS
500.0000 mg | ORAL_TABLET | Freq: Two times a day (BID) | ORAL | Status: DC
  Administered 2018-05-27 – 2018-05-29 (×5): 500 mg via ORAL
  Filled 2018-05-27 (×6): qty 1

## 2018-05-27 MED ORDER — BUDESONIDE 0.5 MG/2ML IN SUSP
0.5000 mg | Freq: Two times a day (BID) | RESPIRATORY_TRACT | Status: DC
  Filled 2018-05-27: qty 2

## 2018-05-27 MED ORDER — BECLOMETHASONE DIPROPIONATE 40 MCG/ACT IN AERS: 2.0000 | INHALATION_SPRAY | Freq: Two times a day (BID) | RESPIRATORY_TRACT | Status: DC

## 2018-05-27 MED ORDER — LEVOTHYROXINE SODIUM 50 MCG PO TABS
50.0000 ug | ORAL_TABLET | Freq: Every day | ORAL | Status: DC
  Administered 2018-05-27 – 2018-05-29 (×3): 50 ug via ORAL
  Filled 2018-05-27 (×3): qty 1

## 2018-05-27 MED ORDER — METRONIDAZOLE IVPB CUSTOM
250.0000 mg | Freq: Three times a day (TID) | INTRAVENOUS | Status: DC
  Filled 2018-05-27: qty 50

## 2018-05-27 MED ORDER — METRONIDAZOLE 500 MG PO TABS
250.0000 mg | ORAL_TABLET | Freq: Two times a day (BID) | ORAL | Status: DC
  Administered 2018-05-27 (×2): 250 mg via ORAL
  Filled 2018-05-27 (×4): qty 1

## 2018-05-27 MED ORDER — ACETAMINOPHEN 650 MG RE SUPP: 650.0000 mg | Freq: Four times a day (QID) | RECTAL | Status: DC | PRN

## 2018-05-27 MED ORDER — ONDANSETRON HCL 4 MG/2ML IJ SOLN: 4.0000 mg | Freq: Four times a day (QID) | INTRAMUSCULAR | Status: DC | PRN

## 2018-05-27 MED ORDER — IOPAMIDOL (ISOVUE-300) INJECTION 61%: 15.0000 mL | INTRAVENOUS | Status: DC

## 2018-05-27 MED ORDER — ONDANSETRON HCL 4 MG/2ML IJ SOLN
4.0000 mg | INTRAMUSCULAR | Status: AC
  Administered 2018-05-27: 4 mg via INTRAVENOUS
  Filled 2018-05-27: qty 2

## 2018-05-27 MED ORDER — IPRATROPIUM BROMIDE 0.02 % IN SOLN: 0.5000 mg | Freq: Four times a day (QID) | RESPIRATORY_TRACT | Status: DC | PRN

## 2018-05-27 MED ORDER — DOCUSATE SODIUM 100 MG PO CAPS
100.0000 mg | ORAL_CAPSULE | Freq: Two times a day (BID) | ORAL | Status: DC
  Filled 2018-05-27 (×4): qty 1

## 2018-05-27 MED ORDER — SODIUM CHLORIDE 0.9 % IV SOLN
INTRAVENOUS | Status: DC
  Administered 2018-05-27 – 2018-05-28 (×3): via INTRAVENOUS

## 2018-05-27 MED ORDER — ACETAMINOPHEN 500 MG PO TABS
1000.0000 mg | ORAL_TABLET | Freq: Once | ORAL | Status: AC
  Administered 2018-05-27: 1000 mg via ORAL
  Filled 2018-05-27: qty 2

## 2018-05-27 MED ORDER — MORPHINE SULFATE (PF) 2 MG/ML IV SOLN: 2.0000 mg | INTRAVENOUS | Status: DC | PRN

## 2018-05-27 MED ORDER — ACETAMINOPHEN 325 MG PO TABS
650.0000 mg | ORAL_TABLET | Freq: Four times a day (QID) | ORAL | Status: DC | PRN
  Administered 2018-05-27 – 2018-05-29 (×5): 650 mg via ORAL
  Filled 2018-05-27 (×5): qty 2

## 2018-05-27 MED ORDER — SERTRALINE HCL 50 MG PO TABS
25.0000 mg | ORAL_TABLET | Freq: Three times a day (TID) | ORAL | Status: DC
  Administered 2018-05-27 – 2018-05-28 (×6): 25 mg via ORAL
  Filled 2018-05-27 (×9): qty 1

## 2018-05-27 MED ORDER — OXYCODONE-ACETAMINOPHEN 5-325 MG PO TABS
1.0000 | ORAL_TABLET | Freq: Four times a day (QID) | ORAL | Status: DC | PRN
  Administered 2018-05-27: 1 via ORAL
  Filled 2018-05-27: qty 1

## 2018-05-27 MED ORDER — METRONIDAZOLE IVPB CUSTOM
250.0000 mg | Freq: Once | INTRAVENOUS | Status: DC
  Filled 2018-05-27: qty 50

## 2018-05-27 MED ORDER — LOPERAMIDE HCL 2 MG PO CAPS
2.0000 mg | ORAL_CAPSULE | ORAL | Status: DC | PRN
  Administered 2018-05-27 – 2018-05-29 (×3): 2 mg via ORAL
  Filled 2018-05-27 (×5): qty 1

## 2018-05-27 MED ORDER — TRAZODONE HCL 100 MG PO TABS
200.0000 mg | ORAL_TABLET | Freq: Every day | ORAL | Status: DC
  Administered 2018-05-27 – 2018-05-28 (×2): 200 mg via ORAL
  Filled 2018-05-27 (×4): qty 2

## 2018-05-27 MED ORDER — PROMETHAZINE HCL 25 MG/ML IJ SOLN
12.5000 mg | INTRAMUSCULAR | Status: DC | PRN
  Administered 2018-05-27 – 2018-05-29 (×4): 12.5 mg via INTRAVENOUS
  Filled 2018-05-27 (×5): qty 1

## 2018-05-27 MED ORDER — LORAZEPAM 2 MG/ML IJ SOLN
1.0000 mg | INTRAMUSCULAR | Status: DC | PRN
  Administered 2018-05-27: 0.5 mg via INTRAVENOUS

## 2018-05-27 MED ORDER — LORATADINE 10 MG PO TABS
10.0000 mg | ORAL_TABLET | Freq: Every day | ORAL | Status: DC
  Administered 2018-05-27 – 2018-05-29 (×3): 10 mg via ORAL
  Filled 2018-05-27 (×3): qty 1

## 2018-05-27 MED ORDER — ALBUTEROL SULFATE (2.5 MG/3ML) 0.083% IN NEBU: 2.5000 mg | INHALATION_SOLUTION | RESPIRATORY_TRACT | Status: DC | PRN

## 2018-05-27 MED ORDER — TRAZODONE HCL 100 MG PO TABS
100.0000 mg | ORAL_TABLET | ORAL | Status: AC
  Administered 2018-05-27: 100 mg via ORAL
  Filled 2018-05-27: qty 1

## 2018-05-27 MED ORDER — SODIUM CHLORIDE 0.9 % IV SOLN
2.0000 g | INTRAVENOUS | Status: DC
  Administered 2018-05-27: 2 g via INTRAVENOUS
  Filled 2018-05-27: qty 20

## 2018-05-27 MED ORDER — ENOXAPARIN SODIUM 40 MG/0.4ML ~~LOC~~ SOLN
40.0000 mg | SUBCUTANEOUS | Status: DC
  Filled 2018-05-27: qty 0.4

## 2018-05-27 NOTE — H&P (Signed)
Mariah Nguyen is an 47 y.o. female.   Chief Complaint: Abdominal pain HPI: The patient with past medical history of ulcerative colitis status post proctocolectomy presents to the emergency department with abdominal pain and fever.  The patient's temperature was 102.1 F upon arrival.  Denies nausea and vomiting.  She reports that this happens intermittently when her J-pouch becomes inflamed but that she feels worse than usual with this encounter.  Patient met criteria for sepsis which prompted initiation of sepsis protocol.  The patient received ciprofloxacin and Flagyl as well as a dose of ceftriaxone.  Noncontrast (patient preference) CT of the abdomen showed likely inflammation of her J-pouch which brought to the emergency department staff called the hospitalist service for admission.  Past Medical History:  Diagnosis Date  . Exercise-induced asthma   . Ulcerative colitis ([place]) 1996    Past Surgical History:  Procedure Laterality Date  . CESAREAN SECTION  2002  . PARTIAL HYSTERECTOMY  08/2007  . Right eye surgery  07/15/12   [facility]  . Stripping of varicose veins in legs    . TONSILLECTOMY AND ADENOIDECTOMY  01/2005  . Total proctocolectomy with J-pouch  1997   History of recurrent pouchitis    History reviewed. No pertinent family history. None Social History:  reports that she has never smoked. She has never used smokeless tobacco. She reports that she drinks alcohol. She reports that she does not use drugs.  Allergies:  Allergies  Allergen Reactions  . Bentyl  [Dicyclomine]   . Flagyl [Metronidazole]     reportedly has had "nerve damage" in the past, but is prescribed Flagyl by GI doctor at [facility] (2019)  . Meperidine   . Tinidazole Nausea And Vomiting  . Demerol [Meperidine Hcl] Rash  . Povidone Iodine Rash  . Sulfa Antibiotics Rash    Medications Prior to Admission  Medication Sig Dispense Refill  . albuterol (VENTOLIN HFA) 108 (90 BASE) MCG/ACT inhaler Inhale 2  puffs into the lungs every 6 (six) hours as needed for wheezing or shortness of breath. 1 Inhaler 2  . ALPRAZolam (XANAX) 0.5 MG tablet Take 1 tablet (0.5 mg total) by mouth 2 (two) times daily as needed for anxiety. 60 tablet 5  . beclomethasone (QVAR) 40 MCG/ACT inhaler Inhale 2 puffs into the lungs 2 (two) times daily.    . ciprofloxacin (CIPRO) 500 MG tablet Take 500 mg by mouth 2 (two) times daily.     Marland Kitchen levothyroxine (SYNTHROID, LEVOTHROID) 50 MCG tablet Take 1 tablet (50 mcg total) by mouth daily before breakfast. 90 tablet 1  . metroNIDAZOLE (FLAGYL) 500 MG tablet Take 250 mg by mouth 2 (two) times daily.   0  . montelukast (SINGULAIR) 10 MG tablet TAKE 1 TABLET EVERY DAY AS DIRECTED 90 tablet 1  . promethazine (PHENERGAN) 25 MG tablet Take 1 tablet (25 mg total) by mouth every 4 (four) hours as needed. 30 tablet 5  . sertraline (ZOLOFT) 25 MG tablet Take 25 mg by mouth 3 (three) times daily.  1  . traZODone (DESYREL) 100 MG tablet TAKE 2 TABLETS (200 MG TOTAL) BY MOUTH AT BEDTIME. 180 tablet 1  . Vitamin D, Ergocalciferol, (DRISDOL) 50000 units CAPS capsule Take 1 capsule (50,000 Units total) by mouth every 7 (seven) days. 12 capsule 2  . acetaminophen (TYLENOL) 325 MG tablet Take 650 mg by mouth every 6 (six) hours as needed for mild pain.    . cetirizine (ZYRTEC ALLERGY) 10 MG tablet Take 10 mg by  mouth daily.     . Cranberry Fruit 405 MG CAPS Take 1 capsule by mouth daily.     . cyclobenzaprine (FLEXERIL) 10 MG tablet Take 1 tablet (10 mg total) by mouth at bedtime. (Patient taking differently: Take 10 mg by mouth at bedtime as needed for muscle spasms. ) 30 tablet 5  . cycloSPORINE (RESTASIS) 0.05 % ophthalmic emulsion Place 1 drop into both eyes 2 (two) times daily.     . diphenoxylate-atropine (LOMOTIL) 2.5-0.025 MG tablet Take 1 tablet by mouth 4 (four) times daily as needed for diarrhea or loose stools.     Marland Kitchen EPINEPHrine (EPIPEN 2-PAK) 0.3 mg/0.3 mL IJ SOAJ injection Inject 0.3 mg  into the muscle once.     . ferrous sulfate 325 (65 FE) MG tablet Take 325 mg by mouth daily with breakfast.    . Nutritional Supplements (QUINOA KALE & HEMP) LIQD Place 10 drops under the tongue 2 (two) times daily.    . NYSTATIN powder APPLY TO AFFECTED AREA TWICE A DAY (Patient not taking: Reported on 05/27/2018) 60 g 0  . Potassium 99 MG TABS Take 1 tablet by mouth daily.    . Simethicone 180 MG CAPS Take 180 mg by mouth 2 (two) times daily.    Marland Kitchen terconazole (TERAZOL 3) 0.8 % vaginal cream Insert vaginally once weekly (Patient not taking: Reported on 09/30/2017) 20 g 3    Results for orders placed or performed during the hospital encounter of 05/26/18 (from the past 48 hour(s))  Brain natriuretic peptide     Status: None   Collection Time: 05/26/18 11:30 PM  Result Value Ref Range   B Natriuretic Peptide 15.0 0.0 - 100.0 pg/mL    Comment: Performed at [facility], [address] [place], [place] [address]  Comprehensive metabolic panel     Status: Abnormal   Collection Time: 05/26/18 11:54 PM  Result Value Ref Range   Sodium 137 135 - 145 mmol/L   Potassium 3.4 (L) 3.5 - 5.1 mmol/L   Chloride 107 98 - 111 mmol/L   CO2 23 22 - 32 mmol/L   Glucose, Bld 91 70 - 99 mg/dL   BUN 10 6 - 20 mg/dL   Creatinine, Ser 0.93 0.44 - 1.00 mg/dL   Calcium 9.0 8.9 - 10.3 mg/dL   Total Protein 7.1 6.5 - 8.1 g/dL   Albumin 3.9 3.5 - 5.0 g/dL   AST 40 15 - 41 U/L   ALT 25 0 - 44 U/L   Alkaline Phosphatase 89 38 - 126 U/L   Total Bilirubin 0.5 0.3 - 1.2 mg/dL   GFR calc non Af Amer >60 >60 mL/min   GFR calc Af Amer >60 >60 mL/min    Comment: (NOTE) The eGFR has been calculated using the CKD EPI equation. This calculation has not been validated in all clinical situations. eGFR's persistently <60 mL/min signify possible Chronic Kidney Disease.    Anion gap 7 5 - 15    Comment: Performed at [facility], [address] [place], [place] [address]  Lipase, blood     Status:  None   Collection Time: 05/26/18 11:54 PM  Result Value Ref Range   Lipase 34 11 - 51 U/L    Comment: Performed at [facility], [address] [place], [place] [address]  Lactic acid, plasma     Status: Abnormal   Collection Time: 05/26/18 11:54 PM  Result Value Ref Range   Lactic Acid, Venous 2.2 (HH)  0.5 - 1.9 mmol/L    Comment: CRITICAL RESULT CALLED TO, READ BACK BY AND VERIFIED WITH KALA KEEN @0037  05/27/18 [facility] Performed at [facility], [address] [place], [place] [address]   CBC with Differential     Status: Abnormal   Collection Time: 05/26/18 11:54 PM  Result Value Ref Range   WBC 7.9 3.6 - 11.0 K/uL   RBC 4.74 3.80 - 5.20 MIL/uL   Hemoglobin 14.0 12.0 - 16.0 g/dL   HCT 39.6 35.0 - 47.0 %   MCV 83.6 80.0 - 100.0 fL   MCH 29.6 26.0 - 34.0 pg   MCHC 35.4 32.0 - 36.0 g/dL   RDW 13.3 11.5 - 14.5 %   Platelets 214 150 - 440 K/uL   Neutrophils Relative % 88 %   Neutro Abs 7.0 (H) 1.4 - 6.5 K/uL   Lymphocytes Relative 7 %   Lymphs Abs 0.6 (L) 1.0 - 3.6 K/uL   Monocytes Relative 4 %   Monocytes Absolute 0.3 0.2 - 0.9 K/uL   Eosinophils Relative 0 %   Eosinophils Absolute 0.0 0 - 0.7 K/uL   Basophils Relative 1 %   Basophils Absolute 0.1 0 - 0.1 K/uL    Comment: Performed at [facility], [address] [place], [place] [address]  Urinalysis, Routine w reflex microscopic     Status: Abnormal   Collection Time: 05/26/18 11:54 PM  Result Value Ref Range   Color, Urine YELLOW (A) YELLOW   APPearance CLEAR (A) CLEAR   Specific Gravity, Urine 1.014 1.005 - 1.030   pH 8.0 5.0 - 8.0   Glucose, UA NEGATIVE NEGATIVE mg/dL   Hgb urine dipstick NEGATIVE NEGATIVE   Bilirubin Urine NEGATIVE NEGATIVE   Ketones, ur NEGATIVE NEGATIVE mg/dL   Protein, ur NEGATIVE NEGATIVE mg/dL   Nitrite NEGATIVE NEGATIVE   Leukocytes, UA NEGATIVE NEGATIVE    Comment: Performed at [facility], [address] [place], [place] [address]  Troponin I      Status: None   Collection Time: 05/26/18 11:54 PM  Result Value Ref Range   Troponin I <0.03 <0.03 ng/mL    Comment: Performed at [facility], [address] [place], [place] [address]  Procalcitonin     Status: None   Collection Time: 05/26/18 11:54 PM  Result Value Ref Range   Procalcitonin 0.27 ng/mL    Comment:        Interpretation: PCT (Procalcitonin) <= 0.5 ng/mL: Systemic infection (sepsis) is not likely. Local bacterial infection is possible. (NOTE)       Sepsis PCT Algorithm           Lower Respiratory Tract                                      Infection PCT Algorithm    ----------------------------     ----------------------------         PCT < 0.25 ng/mL                PCT < 0.10 ng/mL         Strongly encourage             Strongly discourage   discontinuation of antibiotics    initiation of antibiotics    ----------------------------     -----------------------------       PCT 0.25 - 0.50 ng/mL  PCT 0.10 - 0.25 ng/mL               OR       >80% decrease in PCT            Discourage initiation of                                            antibiotics      Encourage discontinuation           of antibiotics    ----------------------------     -----------------------------         PCT >= 0.50 ng/mL              PCT 0.26 - 0.50 ng/mL               AND        <80% decrease in PCT             Encourage initiation of                                             antibiotics       Encourage continuation           of antibiotics    ----------------------------     -----------------------------        PCT >= 0.50 ng/mL                  PCT > 0.50 ng/mL               AND         increase in PCT                  Strongly encourage                                      initiation of antibiotics    Strongly encourage escalation           of antibiotics                                     -----------------------------                                            PCT <= 0.25 ng/mL                                                 OR                                        > 80% decrease in PCT  Discontinue / Do not initiate                                             antibiotics Performed at [facility], [address] [place], [place] [address]   Protime-INR     Status: None   Collection Time: 05/26/18 11:54 PM  Result Value Ref Range   Prothrombin Time 14.4 11.4 - 15.2 seconds   INR 1.13     Comment: Performed at [facility], [address], [place], [place] [address]  Blood Culture (routine x 2)     Status: None (Preliminary result)   Collection Time: 05/26/18 11:54 PM  Result Value Ref Range   Specimen Description BLOOD RIGHT ANTECUBITAL    Special Requests      BOTTLES DRAWN AEROBIC AND ANAEROBIC Blood Culture results may not be optimal due to an excessive volume of blood received in culture bottles   Culture      NO GROWTH < 12 HOURS Performed at [facility], [address], [place], [place] [address]    Report Status PENDING   Blood Culture (routine x 2)     Status: None (Preliminary result)   Collection Time: 05/26/18 11:54 PM  Result Value Ref Range   Specimen Description BLOOD LEFT ANTECUBITAL    Special Requests      BOTTLES DRAWN AEROBIC AND ANAEROBIC Blood Culture results may not be optimal due to an excessive volume of blood received in culture bottles   Culture      NO GROWTH < 12 HOURS Performed at [facility], [address], [place], [place] [address]    Report Status PENDING   C difficile quick scan w PCR reflex     Status: None   Collection Time: 05/26/18 11:54 PM  Result Value Ref Range   C Diff antigen NEGATIVE NEGATIVE   C Diff toxin NEGATIVE NEGATIVE   C Diff interpretation No C. difficile detected.     Comment: Performed at [facility], [address] [place], [place] [address]  Gastrointestinal Panel by PCR , Stool      Status: None   Collection Time: 05/26/18 11:54 PM  Result Value Ref Range   Campylobacter species NOT DETECTED NOT DETECTED   Plesimonas shigelloides NOT DETECTED NOT DETECTED   Salmonella species NOT DETECTED NOT DETECTED   Yersinia enterocolitica NOT DETECTED NOT DETECTED   Vibrio species NOT DETECTED NOT DETECTED   Vibrio cholerae NOT DETECTED NOT DETECTED   Enteroaggregative E coli (EAEC) NOT DETECTED NOT DETECTED   Enteropathogenic E coli (EPEC) NOT DETECTED NOT DETECTED   Enterotoxigenic E coli (ETEC) NOT DETECTED NOT DETECTED   Shiga like toxin producing E coli (STEC) NOT DETECTED NOT DETECTED   Shigella/Enteroinvasive E coli (EIEC) NOT DETECTED NOT DETECTED   Cryptosporidium NOT DETECTED NOT DETECTED   Cyclospora cayetanensis NOT DETECTED NOT DETECTED   Entamoeba histolytica NOT DETECTED NOT DETECTED   Giardia lamblia NOT DETECTED NOT DETECTED   Adenovirus F40/41 NOT DETECTED NOT DETECTED   Astrovirus NOT DETECTED NOT DETECTED   Norovirus GI/GII NOT DETECTED NOT DETECTED   Rotavirus A NOT DETECTED NOT DETECTED   Sapovirus (I, II, IV, and V) NOT DETECTED NOT DETECTED    Comment: Performed at [facility], [address] [place],  [address]  Pregnancy, urine     Status:  None   Collection Time: 05/26/18 11:54 PM  Result Value Ref Range   Preg Test, Ur NEGATIVE NEGATIVE    Comment: Performed at [facility], [address] [place], [place] [address]  Lactic acid, plasma     Status: None   Collection Time: 05/27/18  1:33 AM  Result Value Ref Range   Lactic Acid, Venous 1.9 0.5 - 1.9 mmol/L    Comment: Performed at [facility], [address] [place], [place] [address]  Mononucleosis screen     Status: None   Collection Time: 05/27/18  3:48 AM  Result Value Ref Range   Mono Screen NEGATIVE NEGATIVE    Comment: Performed at [facility], [address] [place], [place] [address]  TSH     Status: Abnormal   Collection Time:  05/27/18  3:48 AM  Result Value Ref Range   TSH 0.324 (L) 0.350 - 4.500 uIU/mL    Comment: Performed by a 3rd Generation assay with a functional sensitivity of <=0.01 uIU/mL. Performed at [facility], [address], [place], [place] [address]    Ct Abdomen Pelvis Wo Contrast  Result Date: 05/27/2018 CLINICAL DATA:  47 y/o F; found on floor with difficulty breathing. Abdominal and back pain. EXAM: CT ABDOMEN AND PELVIS WITHOUT CONTRAST TECHNIQUE: Multidetector CT imaging of the abdomen and pelvis was performed following the standard protocol without IV contrast. COMPARISON:  None. FINDINGS: Lower chest: No acute abnormality. Hepatobiliary: No focal liver abnormality is seen. No gallstones, gallbladder wall thickening, or biliary dilatation. Pancreas: Unremarkable. No pancreatic ductal dilatation or surrounding inflammatory changes. Spleen: The spleen measures 9.8 x 5.0 x 11.8 cm (volume = 300 cm^3). Adrenals/Urinary Tract: Adrenal glands are unremarkable. Kidneys are normal, without renal calculi, focal lesion, or hydronephrosis. Bladder is unremarkable. Stomach/Bowel: Postsurgical changes related to proctectomy and J-pouch. No findings of bowel obstruction. Mild wall thickening of the GB pouch. Vascular/Lymphatic: No significant vascular findings are present. No enlarged abdominal or pelvic lymph nodes. Reproductive: Status post hysterectomy. No adnexal masses. Other: No abdominal wall hernia or abnormality. No abdominopelvic ascites. Musculoskeletal: No fracture is seen. IMPRESSION: 1. Prostate ectomy with J-pouch. Mild wall thickening of the J-pouch may reflect infection or inflammation. 2. Borderline splenomegaly. Electronically Signed   By: Kristine Garbe M.D.   On: 05/27/2018 03:26   Dg Chest Port 1 View  Result Date: 05/26/2018 CLINICAL DATA:  47 year old female with sepsis. EXAM: PORTABLE CHEST 1 VIEW COMPARISON:  None. FINDINGS: The heart size and mediastinal contours are  within normal limits. Both lungs are clear. The visualized skeletal structures are unremarkable. IMPRESSION: No active disease. Electronically Signed   By: Anner Crete M.D.   On: 05/26/2018 23:52    Review of Systems  Constitutional: Negative for chills and fever.  HENT: Negative for sore throat and tinnitus.   Eyes: Negative for blurred vision and redness.  Respiratory: Negative for cough and shortness of breath.   Cardiovascular: Negative for chest pain, palpitations, orthopnea and PND.  Gastrointestinal: Positive for abdominal pain. Negative for diarrhea, nausea and vomiting.  Genitourinary: Negative for dysuria, frequency and urgency.  Musculoskeletal: Negative for joint pain and myalgias.  Skin: Negative for rash.       No lesions  Neurological: Negative for speech change, focal weakness and weakness.  Endo/Heme/Allergies: Does not bruise/bleed easily.       No temperature intolerance  Psychiatric/Behavioral: Negative for depression and suicidal ideas.    Blood pressure 91/63, pulse 99, temperature 99.5 F (  37.5 C), temperature source Oral, resp. rate 18, height 5' 3"  (1.6 m), weight 58.6 kg, SpO2 99 %. Physical Exam  Vitals reviewed. Constitutional: She is oriented to person, place, and time. She appears well-developed and well-nourished. No distress.  HENT:  Head: Normocephalic and atraumatic.  Mouth/Throat: Oropharynx is clear and moist.  Eyes: Pupils are equal, round, and reactive to light. Conjunctivae and EOM are normal. No scleral icterus.  Neck: Normal range of motion. Neck supple. No JVD present. No tracheal deviation present. No thyromegaly present.  Cardiovascular: Normal rate, regular rhythm and normal heart sounds. Exam reveals no gallop and no friction rub.  No murmur heard. Respiratory: Effort normal and breath sounds normal.  GI: Soft. Bowel sounds are normal. She exhibits no distension and no mass. There is tenderness. There is no rebound and no guarding.   Genitourinary:  Genitourinary Comments: Deferred  Musculoskeletal: Normal range of motion. She exhibits no edema.  Lymphadenopathy:    She has no cervical adenopathy.  Neurological: She is alert and oriented to person, place, and time. No cranial nerve deficit. She exhibits normal muscle tone.  Skin: Skin is warm and dry. No rash noted. No erythema.  Psychiatric: She has a normal mood and affect. Her behavior is normal. Judgment and thought content normal.     Assessment/Plan This is a 47 year old female admitted for pouchitis 1.  Pouchitis: Continue Cipro and Flagyl.  Hydrate with advance fluid.  Manage pain with IV morphine. 2.  Sepsis: The patient meets criteria via fever, tachycardia, intermittent tachypnea and leukocytosis.  She is hemodynamically stable.  Procalcitonin was negative.  I have not continued IV antibiotics.  Follow blood cultures for growth and sensitivities. 3.  Asthma: Continue inhaled corticosteroid as well as albuterol as needed 4.  Hypothyroidism: Check TSH; continue Synthroid Exline 5.  DVT prophylaxis: Lovenox 6.  GI prophylaxis: None The patient is a full code.  Time spent on admission orders and patient care approximately 45 minutes  Harrie Foreman, MD 05/27/2018, 7:50 AM

## 2018-05-27 NOTE — Progress Notes (Signed)
Initial Nutrition Assessment  DOCUMENTATION CODES:   Not applicable  INTERVENTION:   - RD left low-FODMAP education handout in pt's room and will attempt to complete education as time allows (pt refused in-person education today)  NUTRITION DIAGNOSIS:   Increased nutrient needs related to chronic illness (chronic pouchitis) as evidenced by estimated needs.  GOAL:   Patient will meet greater than or equal to 90% of their needs  MONITOR:   PO intake, Weight trends, I & O's, Labs  REASON FOR ASSESSMENT:   Consult Assessment of nutrition requirement/status (low-FODMAP diet)  ASSESSMENT:   47 year old female who presented to the ED on 9/23 with fever, SOB, abdominal pain, and diarrhea. PMH significant for chronic pouchitis and ulcerative colitis s/p total proctocolectomy (1997). Sepsis protocol initiated.  Pt with J-pouch.  Attempted to speak with pt at bedside x 3. Pt refused each time either due to wanting to sleep or being in pain and needing RN assistance. RD left "Low- FODMAP Nutrition Therapy" handout in room for pt to review. Will attempt to follow-up and provide education as time allows.  Discussed pt with RN. RN reports pt ate [employer] chicken sandwich while on full liquid diet despite education regarding importance of diet order. After pt ate sandwich, diet order advanced to regular. Per RN, pt ate pancakes, bacon, and juice for breakfast. Pt states that she follows a low-FODMAP diet at home despite eating foods that do not fall into that diet.  Per weight history in chat, pt's weight has fluctuated between 120-132 lbs over the past 2 years. Current weight is 129 lbs.  Meal Completion: 100%  Medications reviewed and include: 100 mg Colace BID, 50 mcg levothyroxine daily, 150 mg Flagyl BID, 500 mg Cipro BID, IV NaCl  Labs reviewed: potassium 3.4 (L)  NUTRITION - FOCUSED PHYSICAL EXAM:  Pt refused.  Diet Order:   Diet Order            Diet regular Room  service appropriate? Yes; Fluid consistency: Thin  Diet effective now              EDUCATION NEEDS:   Education needs have been addressed  Skin:  Skin Assessment: Reviewed RN Assessment  Last BM:  unknown/PTA  Height:   Ht Readings from Last 1 Encounters:  05/27/18 5\' 3"  (1.6 m)    Weight:   Wt Readings from Last 1 Encounters:  05/27/18 58.6 kg    Ideal Body Weight:  52.27 kg  BMI:  Body mass index is 22.88 kg/m.  Estimated Nutritional Needs:   Kcal:  1700-1900  Protein:  85-100 grams  Fluid:  1.7-1.9 L    Gaynell Face, MS, RD, LDN Inpatient Clinical Dietitian Pager: [phone] Weekend/After Hours: [phone]

## 2018-05-27 NOTE — Progress Notes (Addendum)
Advanced care plan. Purpose of the Encounter: CODE STATUS Parties in Attendance: Patient Patient's Decision Capacity: Good Subjective/Patient's story: Presented to emergency room for abdominal discomfort Patient has J-pouch Objective/Medical story Patient needs IV antibiotics for pouchitis She has a history of ulcerative colitis Pain management Goals of care determination:  Advance care directives goals of care discussed Patient wants everything done which includes CPR and intubation and ventilator if the need arises CODE STATUS: Full code Time spent discussing advanced care planning: 16 minutes

## 2018-05-28 LAB — URINE CULTURE: CULTURE: NO GROWTH

## 2018-05-28 LAB — BASIC METABOLIC PANEL
ANION GAP: 5 (ref 5–15)
BUN: <5 mg/dL — ABNORMAL LOW (ref 6–20)
CALCIUM: 7.5 mg/dL — AB (ref 8.9–10.3)
CO2: 26 mmol/L (ref 22–32)
Chloride: 112 mmol/L — ABNORMAL HIGH (ref 98–111)
Creatinine, Ser: 0.73 mg/dL (ref 0.44–1.00)
GFR calc non Af Amer: >60 mL/min (ref >60)
Glucose, Bld: 107 mg/dL — ABNORMAL HIGH (ref 70–99)
Potassium: 2.8 mmol/L — ABNORMAL LOW (ref 3.5–5.1)
Sodium: 143 mmol/L (ref 135–145)

## 2018-05-28 LAB — MAGNESIUM: MAGNESIUM: 1.5 mg/dL — AB (ref 1.7–2.4)

## 2018-05-28 MED ORDER — POTASSIUM CHLORIDE CRYS ER 20 MEQ PO TBCR
40.0000 meq | EXTENDED_RELEASE_TABLET | ORAL | Status: AC
  Administered 2018-05-28 (×2): 40 meq via ORAL
  Filled 2018-05-28 (×2): qty 2

## 2018-05-28 MED ORDER — METRONIDAZOLE 500 MG PO TABS
500.0000 mg | ORAL_TABLET | Freq: Three times a day (TID) | ORAL | Status: DC
  Administered 2018-05-28 – 2018-05-29 (×4): 500 mg via ORAL
  Filled 2018-05-28 (×6): qty 1

## 2018-05-28 NOTE — Plan of Care (Signed)

## 2018-05-28 NOTE — Progress Notes (Signed)
Patient vomited one time this am and has some facial swelling. States she may be allergic to detergent on sheets. Pt given claritin and states she will let us know if she has any further problems. Pt given phenergan for nausea and this has been effective.

## 2018-05-28 NOTE — Progress Notes (Addendum)
[facility] NAME: Mariah Nguyen    MR#:  [id number]  DATE OF BIRTH:  1971/04/02  SUBJECTIVE:  CHIEF COMPLAINT:  No chief complaint on file.  The patient has multiple complaints including headache, fever, chills, nausea, vomiting, diarrhea. She also has facial swelling, which she think that she has some allergy. REVIEW OF SYSTEMS:  Review of Systems  Constitutional: Positive for chills, fever and malaise/fatigue.  HENT: Negative for sore throat.        Facial swelling  Eyes: Negative for blurred vision and double vision.  Respiratory: Negative for cough, hemoptysis, shortness of breath, wheezing and stridor.   Cardiovascular: Negative for chest pain, palpitations, orthopnea and leg swelling.  Gastrointestinal: Positive for abdominal pain, diarrhea, nausea and vomiting. Negative for blood in stool and melena.  Genitourinary: Negative for dysuria, flank pain and hematuria.  Musculoskeletal: Negative for back pain and joint pain.  Skin: Negative for itching and rash.  Neurological: Negative for dizziness, sensory change, focal weakness, seizures, loss of consciousness, weakness and headaches.  Endo/Heme/Allergies: Negative for polydipsia.  Psychiatric/Behavioral: Negative for depression. The patient is not nervous/anxious.     DRUG ALLERGIES:   Allergies  Allergen Reactions  . Bentyl  [Dicyclomine]   . Flagyl [Metronidazole]     reportedly has had "nerve damage" in the past, but is prescribed Flagyl by GI doctor at [facility] (2019)  . Meperidine   . Tinidazole Nausea And Vomiting  . Demerol [Meperidine Hcl] Rash  . Povidone Iodine Rash  . Sulfa Antibiotics Rash   VITALS:  Blood pressure 122/76, pulse 83, temperature 98.3 F (36.8 C), temperature source Axillary, resp. rate 19, height 5' 3"  (1.6 m), weight 58.7 kg, SpO2 98 %. PHYSICAL EXAMINATION:  Physical Exam  Constitutional: She is oriented to person, place, and time. She  appears well-developed. No distress.  HENT:  Head: Normocephalic.  Mouth/Throat: Oropharynx is clear and moist.  Eyes: Conjunctivae and EOM are normal. No scleral icterus.  Neck: Normal range of motion. Neck supple. No JVD present. No tracheal deviation present.  Cardiovascular: Normal rate, regular rhythm and normal heart sounds. Exam reveals no gallop.  No murmur heard. Pulmonary/Chest: Effort normal and breath sounds normal. No respiratory distress. She has no wheezes. She has no rales.  Abdominal: Soft. Bowel sounds are normal. She exhibits no distension. There is tenderness. There is no rebound.  Musculoskeletal: Normal range of motion. She exhibits no edema or tenderness.  Neurological: She is alert and oriented to person, place, and time. No cranial nerve deficit.  Skin: No rash noted. No erythema.  Psychiatric: She has a normal mood and affect.   LABORATORY PANEL:  Female CBC Recent Labs  Lab 05/26/18 2354  WBC 7.9  HGB 14.0  HCT 39.6  PLT 214   ------------------------------------------------------------------------------------------------------------------ Chemistries  Recent Labs  Lab 05/26/18 2354 05/27/18 0348  NA 137  --   K 3.4*  --   CL 107  --   CO2 23  --   GLUCOSE 91  --   BUN 10  --   CREATININE 0.93  --   CALCIUM 9.0  --   MG  --  1.5*  AST 40  --   ALT 25  --   ALKPHOS 89  --   BILITOT 0.5  --    RADIOLOGY:  No results found. ASSESSMENT AND PLAN:   This is a 47 year old female admitted for pouchitis 1.  Pouchitis: Continue Cipro and Flagyl.  Hydrated with IV fluid.   2.  Sepsis: improved.  Blood cultures are negative so far.  3.  Asthma: Stable, on inhaled corticosteroid as well as albuterol as needed  4.  Hypothyroidism: continue Synthroid.   5.  History of depression and anxiety.  Continue home medication.  6.  Hypokalemia.  Give potassium supplement and follow-up level.  7. Hypomagnesemia.  IV magnesium.  The patient and her  husband had a lot of questions.  I spent long time answering those questions. All the records are reviewed and case discussed with Care Management/Social Worker. Management plans discussed with the patient, her husband and they are in agreement.  CODE STATUS: Full Code  TOTAL TIME TAKING CARE OF THIS PATIENT: 46 minutes.   More than 50% of the time was spent in counseling/coordination of care: YES  POSSIBLE D/C IN 1-2 DAYS, DEPENDING ON CLINICAL CONDITION.   Demetrios Loll M.D on 05/28/2018 at 4:13 PM  Between 7am to 6pm - Pager - [phone]  After 6pm go to [email or web address] - [facility]

## 2018-05-28 NOTE — Consult Note (Signed)
Late Entry  Subjective:   CC: headache, face swelling   HPI:  Mariah Nguyen is a 47 y.o. female who was consulted by Pyreddy for evaluation of  Abdominal pain. First noted a few days ago, along with fever in ED.  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  Colorectal service at [facility] follows.  Surgery consulted for management, but patient on chronic cipro flagyl for this chronic issue.  Although previous notes mentions patient stated that current symptoms usually related to her pouch, She states to me today that none of her symptoms she is currently experiencing is what usually happens with exacerbation of pouch inflammation. She is currently complaining of "fever of 99, because I usually run 98", headache, and "swollen eyes".    Past Medical History:  has a past medical history of Exercise-induced asthma and Ulcerative colitis ([place]) (1996).  Past Surgical History:  has a past surgical history that includes Total proctocolectomy with J-pouch (1997); Partial hysterectomy (08/2007); Tonsillectomy and adenoidectomy (01/2005); Cesarean section (2002); Right eye surgery (07/15/12); and Stripping of varicose veins in legs.  Family History: non-contributory  Social History:  reports that she has never smoked. She has never used smokeless tobacco. She reports that she drinks alcohol. She reports that she does not use drugs.  Current Medications:  Medications Prior to Admission  Medication Sig Dispense Refill  . acetaminophen (TYLENOL) 325 MG tablet Take 650 mg by mouth every 6 (six) hours as needed for mild pain.    Marland Kitchen albuterol (VENTOLIN HFA) 108 (90 BASE) MCG/ACT inhaler Inhale 2 puffs into the lungs every 6 (six) hours as needed for wheezing or shortness of breath. 1 Inhaler 2  . ALPRAZolam (XANAX) 0.5 MG tablet Take 1 tablet (0.5 mg total) by mouth 2 (two) times daily as needed for anxiety. 60 tablet 5  . beclomethasone (QVAR) 40  MCG/ACT inhaler Inhale 2 puffs into the lungs 2 (two) times daily.    . cetirizine (ZYRTEC ALLERGY) 10 MG tablet Take 10 mg by mouth daily.     . ciprofloxacin (CIPRO) 500 MG tablet Take 500 mg by mouth 2 (two) times daily.     . Cranberry Fruit 405 MG CAPS Take 1 capsule by mouth daily.     . cycloSPORINE (RESTASIS) 0.05 % ophthalmic emulsion Place 1 drop into both eyes 2 (two) times daily.     . diphenoxylate-atropine (LOMOTIL) 2.5-0.025 MG tablet Take 1 tablet by mouth 4 (four) times daily as needed for diarrhea or loose stools.     Marland Kitchen EPINEPHrine (EPIPEN 2-PAK) 0.3 mg/0.3 mL IJ SOAJ injection Inject 0.3 mg into the muscle once.     . ferrous sulfate 325 (65 FE) MG tablet Take 325 mg by mouth daily with breakfast.    . levothyroxine (SYNTHROID, LEVOTHROID) 50 MCG tablet Take 1 tablet (50 mcg total) by mouth daily before breakfast. 90 tablet 1  . metroNIDAZOLE (FLAGYL) 500 MG tablet Take 250 mg by mouth 2 (two) times daily.   0  . montelukast (SINGULAIR) 10 MG tablet TAKE 1 TABLET EVERY DAY AS DIRECTED 90 tablet 1  . Potassium 99 MG TABS Take 1 tablet by mouth daily.    . promethazine (PHENERGAN) 25 MG tablet Take 1 tablet (25 mg total) by mouth every 4 (four) hours as needed. 30 tablet 5  . sertraline (ZOLOFT) 25 MG tablet Take 25 mg by mouth 3 (three) times daily.  1  . Simethicone 180  MG CAPS Take 180 mg by mouth 2 (two) times daily.    . traZODone (DESYREL) 100 MG tablet TAKE 2 TABLETS (200 MG TOTAL) BY MOUTH AT BEDTIME. 180 tablet 1  . Vitamin D, Ergocalciferol, (DRISDOL) 50000 units CAPS capsule Take 1 capsule (50,000 Units total) by mouth every 7 (seven) days. 12 capsule 2  . cyclobenzaprine (FLEXERIL) 10 MG tablet Take 1 tablet (10 mg total) by mouth at bedtime. (Patient taking differently: Take 10 mg by mouth at bedtime as needed for muscle spasms. ) 30 tablet 5  . Nutritional Supplements (QUINOA KALE & HEMP) LIQD Place 10 drops under the tongue 2 (two) times daily.    . NYSTATIN powder  APPLY TO AFFECTED AREA TWICE A DAY (Patient not taking: Reported on 05/27/2018) 60 g 0  . terconazole (TERAZOL 3) 0.8 % vaginal cream Insert vaginally once weekly (Patient not taking: Reported on 09/30/2017) 20 g 3    Allergies:  Allergies  Allergen Reactions  . Bentyl  [Dicyclomine]   . Flagyl [Metronidazole]     reportedly has had "nerve damage" in the past, but is prescribed Flagyl by GI doctor at [facility] (2019)  . Meperidine   . Tinidazole Nausea And Vomiting  . Demerol [Meperidine Hcl] Rash  . Povidone Iodine Rash  . Sulfa Antibiotics Rash    ROS:  A 15 point review of systems was performed and pertinent positives and negatives noted in HPI   Objective:     BP 122/76 (BP Location: Left Arm)   Pulse 83   Temp 98.3 F (36.8 C) (Axillary)   Resp 19   Ht 5' 3"  (1.6 m)   Wt 58.7 kg   SpO2 98%   BMI 22.92 kg/m   Constitutional :  alert, cooperative and appears stated age  Lymphatics/Throat:  no asymmetry, masses, or scars  Respiratory:  clear to auscultation bilaterally  Cardiovascular:  regular rate and rhythm  Gastrointestinal: soft, non-tender; bowel sounds normal; no masses,  no organomegaly.  Musculoskeletal: Steady gait and movement  Skin: Cool and moist  Psychiatric: Normal affect, non-agitated, not confused       LABS:  CMP Latest Ref Rng & Units 05/28/2018 05/26/2018 09/30/2017  Glucose 70 - 99 mg/dL 107(H) 91 91  BUN 6 - 20 mg/dL <5(L) 10 8  Creatinine 0.44 - 1.00 mg/dL 0.73 0.93 0.85  Sodium 135 - 145 mmol/L 143 137 143  Potassium 3.5 - 5.1 mmol/L 2.8(L) 3.4(L) 4.3  Chloride 98 - 111 mmol/L 112(H) 107 104  CO2 22 - 32 mmol/L 26 23 24   Calcium 8.9 - 10.3 mg/dL 7.5(L) 9.0 9.1  Total Protein 6.5 - 8.1 g/dL - 7.1 6.7  Total Bilirubin 0.3 - 1.2 mg/dL - 0.5 0.2  Alkaline Phos 38 - 126 U/L - 89 81  AST 15 - 41 U/L - 40 22  ALT 0 - 44 U/L - 25 14   CBC Latest Ref Rng & Units 05/26/2018 09/30/2017 11/01/2016  WBC 3.6 - 11.0 K/uL 7.9 7.3 4.9  Hemoglobin 12.0 - 16.0  g/dL 14.0 12.6 11.1  Hematocrit 35.0 - 47.0 % 39.6 37.6 34.1  Platelets 150 - 440 K/uL 214 241 233    RADS: CLINICAL DATA:  47 y/o F; found on floor with difficulty breathing. Abdominal and back pain.  EXAM: CT ABDOMEN AND PELVIS WITHOUT CONTRAST  TECHNIQUE: Multidetector CT imaging of the abdomen and pelvis was performed following the standard protocol without IV contrast.  COMPARISON:  None.  FINDINGS: Lower chest: No  acute abnormality.  Hepatobiliary: No focal liver abnormality is seen. No gallstones, gallbladder wall thickening, or biliary dilatation.  Pancreas: Unremarkable. No pancreatic ductal dilatation or surrounding inflammatory changes.  Spleen: The spleen measures 9.8 x 5.0 x 11.8 cm (volume = 300 cm^3).  Adrenals/Urinary Tract: Adrenal glands are unremarkable. Kidneys are normal, without renal calculi, focal lesion, or hydronephrosis. Bladder is unremarkable.  Stomach/Bowel: Postsurgical changes related to proctectomy and J-pouch. No findings of bowel obstruction. Mild wall thickening of the GB pouch.  Vascular/Lymphatic: No significant vascular findings are present. No enlarged abdominal or pelvic lymph nodes.  Reproductive: Status post hysterectomy. No adnexal masses.  Other: No abdominal wall hernia or abnormality. No abdominopelvic ascites.  Musculoskeletal: No fracture is seen.  IMPRESSION: 1. Prostate ectomy with J-pouch. Mild wall thickening of the J-pouch may reflect infection or inflammation. 2. Borderline splenomegaly.   Electronically Signed   By: Kristine Garbe M.D.   On: 05/27/2018 03:26  Assessment:     hx of Jpouch inflammation, chronic, on cipro and flagyl, presenting with abdominal pain and fever, which has resolved and now is complaining of headache, eye swelling, and "fever"   Plan:    Doubt Jpouch inflammation, which was minimal at best noted on CT, is cause of current complaints.  Unsure of  the etiology of the vague symptoms, but definitely no need for any surgical intervention.  If all other workup is negative and Jpouch is still a concern, I recommend following up with her colorectal team at  [facility] who has a long term relationship with patient.  Surgery to sign off.  Please call with additional questions.

## 2018-05-29 ENCOUNTER — Telehealth: Payer: Self-pay | Admitting: Physician Assistant

## 2018-05-29 DIAGNOSIS — Z934 Other artificial openings of gastrointestinal tract status: Secondary | ICD-10-CM

## 2018-05-29 DIAGNOSIS — Z792 Long term (current) use of antibiotics: Secondary | ICD-10-CM

## 2018-05-29 DIAGNOSIS — R5381 Other malaise: Secondary | ICD-10-CM

## 2018-05-29 DIAGNOSIS — K91850 Pouchitis: Secondary | ICD-10-CM

## 2018-05-29 DIAGNOSIS — Z882 Allergy status to sulfonamides status: Secondary | ICD-10-CM

## 2018-05-29 DIAGNOSIS — R42 Dizziness and giddiness: Secondary | ICD-10-CM

## 2018-05-29 DIAGNOSIS — H5789 Other specified disorders of eye and adnexa: Secondary | ICD-10-CM

## 2018-05-29 DIAGNOSIS — J029 Acute pharyngitis, unspecified: Secondary | ICD-10-CM

## 2018-05-29 DIAGNOSIS — Z91048 Other nonmedicinal substance allergy status: Secondary | ICD-10-CM

## 2018-05-29 DIAGNOSIS — R509 Fever, unspecified: Secondary | ICD-10-CM

## 2018-05-29 DIAGNOSIS — R0602 Shortness of breath: Secondary | ICD-10-CM

## 2018-05-29 DIAGNOSIS — Z885 Allergy status to narcotic agent status: Secondary | ICD-10-CM

## 2018-05-29 DIAGNOSIS — F419 Anxiety disorder, unspecified: Secondary | ICD-10-CM

## 2018-05-29 DIAGNOSIS — R109 Unspecified abdominal pain: Secondary | ICD-10-CM

## 2018-05-29 DIAGNOSIS — R197 Diarrhea, unspecified: Secondary | ICD-10-CM

## 2018-05-29 DIAGNOSIS — Z9049 Acquired absence of other specified parts of digestive tract: Secondary | ICD-10-CM

## 2018-05-29 DIAGNOSIS — Z881 Allergy status to other antibiotic agents status: Secondary | ICD-10-CM

## 2018-05-29 DIAGNOSIS — R002 Palpitations: Secondary | ICD-10-CM

## 2018-05-29 DIAGNOSIS — K5190 Ulcerative colitis, unspecified, without complications: Secondary | ICD-10-CM

## 2018-05-29 DIAGNOSIS — R112 Nausea with vomiting, unspecified: Secondary | ICD-10-CM

## 2018-05-29 DIAGNOSIS — R5383 Other fatigue: Secondary | ICD-10-CM

## 2018-05-29 DIAGNOSIS — R51 Headache: Secondary | ICD-10-CM

## 2018-05-29 DIAGNOSIS — Z888 Allergy status to other drugs, medicaments and biological substances status: Secondary | ICD-10-CM

## 2018-05-29 LAB — BASIC METABOLIC PANEL
ANION GAP: 5 (ref 5–15)
BUN: <5 mg/dL — ABNORMAL LOW (ref 6–20)
CALCIUM: 8.1 mg/dL — AB (ref 8.9–10.3)
CO2: 27 mmol/L (ref 22–32)
Chloride: 111 mmol/L (ref 98–111)
Creatinine, Ser: 0.78 mg/dL (ref 0.44–1.00)
GFR calc Af Amer: >60 mL/min (ref >60)
GFR calc non Af Amer: >60 mL/min (ref >60)
Glucose, Bld: 92 mg/dL (ref 70–99)
Potassium: 3.2 mmol/L — ABNORMAL LOW (ref 3.5–5.1)
Sodium: 143 mmol/L (ref 135–145)

## 2018-05-29 LAB — MAGNESIUM: Magnesium: 2 mg/dL (ref 1.7–2.4)

## 2018-05-29 MED ORDER — POTASSIUM CHLORIDE CRYS ER 20 MEQ PO TBCR
40.0000 meq | EXTENDED_RELEASE_TABLET | Freq: Once | ORAL | Status: AC
  Administered 2018-05-29: 40 meq via ORAL
  Filled 2018-05-29: qty 2

## 2018-05-29 MED ORDER — LACTATED RINGERS IV SOLN
INTRAVENOUS | Status: AC
  Administered 2018-05-29: 04:00:00 via INTRAVENOUS

## 2018-05-29 NOTE — Progress Notes (Signed)
Pt reported to Dr. Delaine Lame her sore throat. Orders obtained to collect a resp culture and a group strep culture. Will continue to monitor.

## 2018-05-29 NOTE — Discharge Instructions (Signed)
Follow up [facility].

## 2018-05-29 NOTE — Progress Notes (Signed)
ID Full consult note to follow Pt seen Admitted with fever, recent URI Sore throat Check resp viral PCR Throat culture for strep

## 2018-05-29 NOTE — Telephone Encounter (Signed)
Pt is scheduled for Hospital F/U on 06/04/18 @ 11 am. Thanks TNP

## 2018-05-29 NOTE — Discharge Summary (Signed)
[facility] NAME: Mariah Nguyen    MR#:  [id number]  DATE OF BIRTH:  April 28, 1971  DATE OF ADMISSION:  05/26/2018   ADMITTING PHYSICIAN: Harrie Foreman, MD  DATE OF DISCHARGE: 05/29/2018  PRIMARY CARE PHYSICIAN: Mar Daring, PA-C   ADMISSION DIAGNOSIS:  Pouchitis ([place]) [K91.850] Intractable pain [R52] Sepsis, due to unspecified organism [A41.9] Fever, unspecified fever cause [R50.9] DISCHARGE DIAGNOSIS:  Active Problems:   Pouchitis ([place])  SECONDARY DIAGNOSIS:   Past Medical History:  Diagnosis Date  . Exercise-induced asthma   . Ulcerative colitis [facility]) 1996   HOSPITAL COURSE:  This is a 47 year old female admitted for pouchitis 1. Pouchitis: Continue Cipro and Flagyl. The patient said she has Cipro and Flagyl at home.  2. Sepsis: improved.  Blood cultures are negative so far.  Mono is negative.  Per Dr. Delaine Lame, send strep throat test and respiratory viral test, and the patient can go home.  3. Asthma: Stable, on inhaled corticosteroid as well as albuterol as needed  4. Hypothyroidism: continue Synthroid.   5.  History of depression and anxiety.  Continue home medication.  6.  Hypokalemia.    Improving, given potassium supplement and follow-up level with PCP.  7. Hypomagnesemia.  Improved with IV magnesium. I discussed with Dr. Delaine Lame. DISCHARGE CONDITIONS:  Stable, discharge to home today. CONSULTS OBTAINED:  Treatment Team:  Benjamine Sprague, DO DRUG ALLERGIES:   Allergies  Allergen Reactions  . Bentyl  [Dicyclomine]   . Flagyl [Metronidazole]     reportedly has had "nerve damage" in the past, but is prescribed Flagyl by GI doctor at [facility] (2019)  . Meperidine   . Tinidazole Nausea And Vomiting  . Demerol [Meperidine Hcl] Rash  . Povidone Iodine Rash  . Sulfa Antibiotics Rash   DISCHARGE MEDICATIONS:   Allergies as of 05/29/2018      Reactions   Bentyl  [dicyclomine]    Flagyl [metronidazole]    reportedly has had "nerve damage" in the past, but is prescribed Flagyl by GI doctor at [facility] (2019)   Meperidine    Tinidazole Nausea And Vomiting   Demerol [meperidine Hcl] Rash   Povidone Iodine Rash   Sulfa Antibiotics Rash      Medication List    TAKE these medications   acetaminophen 325 MG tablet Commonly known as:  TYLENOL Take 650 mg by mouth every 6 (six) hours as needed for mild pain.   albuterol 108 (90 Base) MCG/ACT inhaler Commonly known as:  PROVENTIL HFA;VENTOLIN HFA Inhale 2 puffs into the lungs every 6 (six) hours as needed for wheezing or shortness of breath.   ALPRAZolam 0.5 MG tablet Commonly known as:  XANAX Take 1 tablet (0.5 mg total) by mouth 2 (two) times daily as needed for anxiety.   beclomethasone 40 MCG/ACT inhaler Commonly known as:  QVAR Inhale 2 puffs into the lungs 2 (two) times daily.   ciprofloxacin 500 MG tablet Commonly known as:  CIPRO Take 500 mg by mouth 2 (two) times daily.   Cranberry Fruit 405 MG Caps Take 1 capsule by mouth daily.   cyclobenzaprine 10 MG tablet Commonly known as:  FLEXERIL Take 1 tablet (10 mg total) by mouth at bedtime. What changed:    when to take this  reasons to take this   EPIPEN 2-PAK 0.3 mg/0.3 mL Soaj injection Generic drug:  EPINEPHrine Inject 0.3 mg into the muscle once.   ferrous sulfate 325 (65 FE) MG  tablet Take 325 mg by mouth daily with breakfast.   levothyroxine 50 MCG tablet Commonly known as:  SYNTHROID, LEVOTHROID Take 1 tablet (50 mcg total) by mouth daily before breakfast.   LOMOTIL 2.5-0.025 MG tablet Generic drug:  diphenoxylate-atropine Take 1 tablet by mouth 4 (four) times daily as needed for diarrhea or loose stools.   metroNIDAZOLE 500 MG tablet Commonly known as:  FLAGYL Take 250 mg by mouth 2 (two) times daily.   montelukast 10 MG tablet Commonly known as:  SINGULAIR TAKE 1 TABLET EVERY DAY AS DIRECTED   nystatin powder Generic drug:   nystatin APPLY TO AFFECTED AREA TWICE A DAY   Potassium 99 MG Tabs Take 1 tablet by mouth daily.   promethazine 25 MG tablet Commonly known as:  PHENERGAN Take 1 tablet (25 mg total) by mouth every 4 (four) hours as needed.   QUINOA KALE & HEMP Liqd Place 10 drops under the tongue 2 (two) times daily.   RESTASIS 0.05 % ophthalmic emulsion Generic drug:  cycloSPORINE Place 1 drop into both eyes 2 (two) times daily.   sertraline 25 MG tablet Commonly known as:  ZOLOFT Take 25 mg by mouth 3 (three) times daily.   Simethicone 180 MG Caps Take 180 mg by mouth 2 (two) times daily.   terconazole 0.8 % vaginal cream Commonly known as:  TERAZOL 3 Insert vaginally once weekly   traZODone 100 MG tablet Commonly known as:  DESYREL TAKE 2 TABLETS (200 MG TOTAL) BY MOUTH AT BEDTIME.   Vitamin D (Ergocalciferol) 50000 units Caps capsule Commonly known as:  DRISDOL Take 1 capsule (50,000 Units total) by mouth every 7 (seven) days.   ZYRTEC ALLERGY 10 MG tablet Generic drug:  cetirizine Take 10 mg by mouth daily.        DISCHARGE INSTRUCTIONS:  See AVS. If you experience worsening of your admission symptoms, develop shortness of breath, life threatening emergency, suicidal or homicidal thoughts you must seek medical attention immediately by calling 911 or calling your MD immediately  if symptoms less severe.  You Must read complete instructions/literature along with all the possible adverse reactions/side effects for all the Medicines you take and that have been prescribed to you. Take any new Medicines after you have completely understood and accpet all the possible adverse reactions/side effects.   Please note  You were cared for by a hospitalist during your hospital stay. If you have any questions about your discharge medications or the care you received while you were in the hospital after you are discharged, you can call the unit and asked to speak with the hospitalist on call  if the hospitalist that took care of you is not available. Once you are discharged, your primary care physician will handle any further medical issues. Please note that NO REFILLS for any discharge medications will be authorized once you are discharged, as it is imperative that you return to your primary care physician (or establish a relationship with a primary care physician if you do not have one) for your aftercare needs so that they can reassess your need for medications and monitor your lab values.    On the day of Discharge:  VITAL SIGNS:  Blood pressure 117/74, pulse 72, temperature 98.5 F (36.9 C), temperature source Oral, resp. rate 18, height 5' 3"  (1.6 m), weight 58.7 kg, SpO2 97 %. PHYSICAL EXAMINATION:  GENERAL:  47 y.o.-year-old patient lying in the bed with no acute distress.  EYES: Pupils equal, round, reactive to  light and accommodation. No scleral icterus. Extraocular muscles intact.  HEENT: Head atraumatic, normocephalic. Oropharynx and nasopharynx clear.  NECK:  Supple, no jugular venous distention. No thyroid enlargement, no tenderness.  LUNGS: Normal breath sounds bilaterally, no wheezing, rales,rhonchi or crepitation. No use of accessory muscles of respiration.  CARDIOVASCULAR: S1, S2 normal. No murmurs, rubs, or gallops.  ABDOMEN: Soft, non-tender, non-distended. Bowel sounds present. No organomegaly or mass.  EXTREMITIES: No pedal edema, cyanosis, or clubbing.  NEUROLOGIC: Cranial nerves II through XII are intact. Muscle strength 5/5 in all extremities. Sensation intact. Gait not checked.  PSYCHIATRIC: The patient is alert and oriented x 3.  SKIN: No obvious rash, lesion, or ulcer.  DATA REVIEW:   CBC Recent Labs  Lab 05/26/18 2354  WBC 7.9  HGB 14.0  HCT 39.6  PLT 214    Chemistries  Recent Labs  Lab 05/26/18 2354  05/29/18 0612  NA 137   < > 143  K 3.4*   < > 3.2*  CL 107   < > 111  CO2 23   < > 27  GLUCOSE 91   < > 92  BUN 10   < > <5*    CREATININE 0.93   < > 0.78  CALCIUM 9.0   < > 8.1*  MG  --    < > 2.0  AST 40  --   --   ALT 25  --   --   ALKPHOS 89  --   --   BILITOT 0.5  --   --    < > = values in this interval not displayed.     Microbiology Results  Results for orders placed or performed during the hospital encounter of 05/26/18  Blood Culture (routine x 2)     Status: None (Preliminary result)   Collection Time: 05/26/18 11:54 PM  Result Value Ref Range Status   Specimen Description BLOOD RIGHT ANTECUBITAL  Final   Special Requests   Final    BOTTLES DRAWN AEROBIC AND ANAEROBIC Blood Culture results may not be optimal due to an excessive volume of blood received in culture bottles   Culture   Final    NO GROWTH 2 DAYS Performed at [facility], [address], [place], [place] [address]    Report Status PENDING  Incomplete  Blood Culture (routine x 2)     Status: None (Preliminary result)   Collection Time: 05/26/18 11:54 PM  Result Value Ref Range Status   Specimen Description BLOOD LEFT ANTECUBITAL  Final   Special Requests   Final    BOTTLES DRAWN AEROBIC AND ANAEROBIC Blood Culture results may not be optimal due to an excessive volume of blood received in culture bottles   Culture   Final    NO GROWTH 2 DAYS Performed at [facility], [address], [place], [place] [address]    Report Status PENDING  Incomplete  Urine culture     Status: None   Collection Time: 05/26/18 11:54 PM  Result Value Ref Range Status   Specimen Description   Final    URINE, RANDOM Performed at [facility], [address], [place], [place] [address]    Special Requests   Final    NONE Performed at [facility], [address] [place], [place] [address]    Culture   Final    NO GROWTH Performed at [facility], [place] [address] [place], [place] [address]    Report  Status 05/28/2018 FINAL  Final  C difficile quick scan w PCR reflex     Status: None   Collection  Time: 05/26/18 11:54 PM  Result Value Ref Range Status   C Diff antigen NEGATIVE NEGATIVE Final   C Diff toxin NEGATIVE NEGATIVE Final   C Diff interpretation No C. difficile detected.  Final    Comment: Performed at [facility], [address] [place], [place] [address]  Gastrointestinal Panel by PCR , Stool     Status: None   Collection Time: 05/26/18 11:54 PM  Result Value Ref Range Status   Campylobacter species NOT DETECTED NOT DETECTED Final   Plesimonas shigelloides NOT DETECTED NOT DETECTED Final   Salmonella species NOT DETECTED NOT DETECTED Final   Yersinia enterocolitica NOT DETECTED NOT DETECTED Final   Vibrio species NOT DETECTED NOT DETECTED Final   Vibrio cholerae NOT DETECTED NOT DETECTED Final   Enteroaggregative E coli (EAEC) NOT DETECTED NOT DETECTED Final   Enteropathogenic E coli (EPEC) NOT DETECTED NOT DETECTED Final   Enterotoxigenic E coli (ETEC) NOT DETECTED NOT DETECTED Final   Shiga like toxin producing E coli (STEC) NOT DETECTED NOT DETECTED Final   Shigella/Enteroinvasive E coli (EIEC) NOT DETECTED NOT DETECTED Final   Cryptosporidium NOT DETECTED NOT DETECTED Final   Cyclospora cayetanensis NOT DETECTED NOT DETECTED Final   Entamoeba histolytica NOT DETECTED NOT DETECTED Final   Giardia lamblia NOT DETECTED NOT DETECTED Final   Adenovirus F40/41 NOT DETECTED NOT DETECTED Final   Astrovirus NOT DETECTED NOT DETECTED Final   Norovirus GI/GII NOT DETECTED NOT DETECTED Final   Rotavirus A NOT DETECTED NOT DETECTED Final   Sapovirus (I, II, IV, and V) NOT DETECTED NOT DETECTED Final    Comment: Performed at [facility], [address], [place],  [address]    RADIOLOGY:  No results found.   Management plans discussed with the patient, family and they are in agreement.  CODE STATUS: Full Code   TOTAL TIME TAKING CARE OF THIS PATIENT: 42 minutes.    Demetrios Loll M.D on 05/29/2018 at 4:15 PM  Between 7am to 6pm - Pager -  [phone]  After 6pm go to [email or web address] - password [email or web address] [facility]  [facility]  Office  [phone]  CC: Primary care physician; Mar Daring, PA-C   Note: This dictation was prepared with Dragon dictation along with smaller phrase technology. Any transcriptional errors that result from this process are unintentional.

## 2018-05-29 NOTE — Progress Notes (Signed)
Pt discharged to home via wheelchair accompanied by husband without incident per MD order. Prior to d/c, all discharge teachings done both written and verbal. All questions answered. Pt and spouse verbalize understanding and agree to comply. Upon discharge, patient pain controlled and no change in patient from AM assessment. Dr. Delaine Lame to call pt with results of respiratory culture and group A strep culture.

## 2018-05-29 NOTE — Consult Note (Signed)
Date of Admission:  05/26/2018                 Reason for Consult: fever    Referring Provider: Bridgett Larsson    HPI: Mariah Nguyen is a 47 y.o. female with h/o ulcerative colitis s/p proctocolectomy in 1997 with creation of J pouch, pouchitis followed by GI at [facility] on chronic suppressive cipro was admitted with sudden onset fever of 102. Pt says this fever was different than her pouchitis fever. She broke the fever within a day with cipro and flagyl. She was being discharged when she asked  to see ID. She says she was traveling for the past 2 weeks and was in [place] visiting her mother.When in [place] she had a URTI. No specific sick contacts but was flying hence at risk for getting infection.She returned on 05/25/18 and had fever on 9/23 after she took a hot bath and when she got out of the tub she felt she couldn't breathe and lay on the floor. She also had back and abdominal pain .EMS was called and they fond her hot to touch-BP-125/82 temp-102.9 HR-112 O2sat-98%  On arrival in ED the notes say she was very anxious, Temp 102.1 BP 114/80 She had Ct abdomen- blood cultures sent and was started on cipro and flagyl. Cultures are neg Says she is feeling better except for some sore throat. Showed a picture of her face with swollen left eyelid in the hospital  Past Medical History:  Diagnosis Date  . Exercise-induced asthma   . Ulcerative colitis ([place]) 1996    Past Surgical History:  Procedure Laterality Date  . CESAREAN SECTION  2002  . PARTIAL HYSTERECTOMY  08/2007  . Right eye surgery  07/15/12   [facility]  . Stripping of varicose veins in legs    . TONSILLECTOMY AND ADENOIDECTOMY  01/2005  . Total proctocolectomy with J-pouch  1997   History of recurrent pouchitis    Social History   Tobacco Use  . Smoking status: Never Smoker  . Smokeless tobacco: Never Used  Substance Use Topics  . Alcohol use: Yes    Alcohol/week: 0.0 standard drinks    Comment: Occasionally  . Drug  use: No    History reviewed. No pertinent family history.   . ciprofloxacin  500 mg Oral BID  . docusate sodium  100 mg Oral BID  . enoxaparin (LOVENOX) injection  40 mg Subcutaneous Q24H  . levothyroxine  50 mcg Oral QAC breakfast  . loratadine  10 mg Oral Daily  . metroNIDAZOLE  500 mg Oral Q8H  . sertraline  25 mg Oral TID  . traZODone  200 mg Oral QHS      Abtx:  Anti-infectives (From admission, onward)   Start     Dose/Rate Route Frequency Ordered Stop   05/28/18 1130  metroNIDAZOLE (FLAGYL) tablet 500 mg     500 mg Oral Every 8 hours 05/28/18 1120     05/27/18 1000  metroNIDAZOLE (FLAGYL) tablet 250 mg  Status:  Discontinued     250 mg Oral 2 times daily 05/27/18 0630 05/28/18 1120   05/27/18 0800  ciprofloxacin (CIPRO) tablet 500 mg     500 mg Oral 2 times daily 05/27/18 0630     05/27/18 0700  metroNIDAZOLE (FLAGYL) IVPB 250 mg  Status:  Discontinued     250 mg 50 mL/hr over 60 Minutes Intravenous Every 8 hours 05/27/18 0401 05/27/18 0507   05/27/18 0515  metroNIDAZOLE (FLAGYL) IVPB 250 mg  Status:  Discontinued     250 mg 50 mL/hr over 60 Minutes Intravenous  Once 05/27/18 0507 05/28/18 1120   05/27/18 0415  cefTRIAXone (ROCEPHIN) 2 g in sodium chloride 0.9 % 100 mL IVPB  Status:  Discontinued     2 g 200 mL/hr over 30 Minutes Intravenous Every 24 hours 05/27/18 0401 05/27/18 0823   05/26/18 2315  metroNIDAZOLE (FLAGYL) tablet 250 mg     250 mg Oral STAT 05/26/18 2314 05/26/18 2331       Review of Systems: Review of Systems  Constitutional: Positive for chills, diaphoresis, fever and malaise/fatigue.  HENT: Positive for sore throat.   Eyes: Positive for redness (left eye swelling and redness ).  Respiratory: Positive for shortness of breath. Negative for cough and sputum production.   Cardiovascular: Positive for palpitations. Negative for leg swelling.  Gastrointestinal: Positive for abdominal pain, diarrhea, nausea and vomiting.  Genitourinary: Negative  for dysuria and frequency.  Musculoskeletal: Positive for back pain, myalgias and neck pain.  Skin: Negative for itching and rash.  Neurological: Positive for dizziness and headaches.  Endo/Heme/Allergies: Negative for environmental allergies. Does not bruise/bleed easily.  Psychiatric/Behavioral: The patient is nervous/anxious.     Allergies  Allergen Reactions  . Bentyl  [Dicyclomine]   . Flagyl [Metronidazole]     reportedly has had "nerve damage" in the past, but is prescribed Flagyl by GI doctor at [facility] (2019)  . Meperidine   . Tinidazole Nausea And Vomiting  . Demerol [Meperidine Hcl] Rash  . Povidone Iodine Rash  . Sulfa Antibiotics Rash    OBJECTIVE: Blood pressure 116/78, pulse 68, temperature 98.6 F (37 C), temperature source Oral, resp. rate 18, height 5\' 3"  (1.6 m), weight 58.7 kg, SpO2 97 %.  Physical Exam  Constitutional: She is oriented to person, place, and time.  Fatigued, pale, thin built  HENT:  Head: Normocephalic.  Oropharyngeal erythema but no exudate or pustules. Pin point superficial erosion over the lower lip  Neck: Normal range of motion. Neck supple. No thyromegaly present.  Cardiovascular: Normal rate and normal heart sounds.  No murmur heard. Pulmonary/Chest: Effort normal and breath sounds normal. She has no wheezes. She has no rales.  Abdominal: Soft. She exhibits no distension. There is no tenderness.  Lap scar  Musculoskeletal: Normal range of motion.  Lymphadenopathy:    She has no cervical adenopathy.  Neurological: She is alert and oriented to person, place, and time.  Skin: Skin is warm and dry.  Psychiatric: She has a normal mood and affect.    Lab Results CBC    Component Value Date/Time   WBC 7.9 05/26/2018 2354   RBC 4.74 05/26/2018 2354   HGB 14.0 05/26/2018 2354   HGB 12.6 09/30/2017 1121   HCT 39.6 05/26/2018 2354   HCT 37.6 09/30/2017 1121   PLT 214 05/26/2018 2354   PLT 241 09/30/2017 1121   MCV 83.6 05/26/2018  2354   MCV 83 09/30/2017 1121   MCV 86 10/18/2012 1335   MCH 29.6 05/26/2018 2354   MCHC 35.4 05/26/2018 2354   RDW 13.3 05/26/2018 2354   RDW 14.0 09/30/2017 1121   RDW 13.8 10/18/2012 1335   LYMPHSABS 0.6 (L) 05/26/2018 2354   LYMPHSABS 1.8 09/30/2017 1121   MONOABS 0.3 05/26/2018 2354   EOSABS 0.0 05/26/2018 2354   EOSABS 0.2 09/30/2017 1121   BASOSABS 0.1 05/26/2018 2354   BASOSABS 0.1 09/30/2017 1121    CMP Latest Ref Rng & Units 05/29/2018 05/28/2018 05/26/2018  Glucose 70 - 99 mg/dL 92 107(H) 91  BUN 6 - 20 mg/dL <5(L) <5(L) 10  Creatinine 0.44 - 1.00 mg/dL 0.78 0.73 0.93  Sodium 135 - 145 mmol/L 143 143 137  Potassium 3.5 - 5.1 mmol/L 3.2(L) 2.8(L) 3.4(L)  Chloride 98 - 111 mmol/L 111 112(H) 107  CO2 22 - 32 mmol/L 27 26 23   Calcium 8.9 - 10.3 mg/dL 8.1(L) 7.5(L) 9.0  Total Protein 6.5 - 8.1 g/dL - - 7.1  Total Bilirubin 0.3 - 1.2 mg/dL - - 0.5  Alkaline Phos 38 - 126 U/L - - 89  AST 15 - 41 U/L - - 40  ALT 0 - 44 U/L - - 25      Microbiology: Recent Results (from the past 240 hour(s))  Blood Culture (routine x 2)     Status: None (Preliminary result)   Collection Time: 05/26/18 11:54 PM  Result Value Ref Range Status   Specimen Description BLOOD RIGHT ANTECUBITAL  Final   Special Requests   Final    BOTTLES DRAWN AEROBIC AND ANAEROBIC Blood Culture results may not be optimal due to an excessive volume of blood received in culture bottles   Culture   Final    NO GROWTH 2 DAYS Performed at [facility], [address] [place], [place] [address]    Report Status PENDING  Incomplete  Blood Culture (routine x 2)     Status: None (Preliminary result)   Collection Time: 05/26/18 11:54 PM  Result Value Ref Range Status   Specimen Description BLOOD LEFT ANTECUBITAL  Final   Special Requests   Final    BOTTLES DRAWN AEROBIC AND ANAEROBIC Blood Culture results may not be optimal due to an excessive volume of blood received in culture bottles   Culture    Final    NO GROWTH 2 DAYS Performed at [facility], [address] [place], [place] [address]    Report Status PENDING  Incomplete  Urine culture     Status: None   Collection Time: 05/26/18 11:54 PM  Result Value Ref Range Status   Specimen Description   Final    URINE, RANDOM Performed at [facility], [address] [place], [place] [address]    Special Requests   Final    NONE Performed at [facility], [address], [place], [place] [address]    Culture   Final    NO GROWTH Performed at [facility], [place] [address] [place], [place] [address]    Report Status 05/28/2018 FINAL  Final  C difficile quick scan w PCR reflex     Status: None   Collection Time: 05/26/18 11:54 PM  Result Value Ref Range Status   C Diff antigen NEGATIVE NEGATIVE Final   C Diff toxin NEGATIVE NEGATIVE Final   C Diff interpretation No C. difficile detected.  Final    Comment: Performed at [facility], [address] [place], [place] [address]  Gastrointestinal Panel by PCR , Stool     Status: None   Collection Time: 05/26/18 11:54 PM  Result Value Ref Range Status   Campylobacter species NOT DETECTED NOT DETECTED Final   Plesimonas shigelloides NOT DETECTED NOT DETECTED Final   Salmonella species NOT DETECTED NOT DETECTED Final   Yersinia enterocolitica NOT DETECTED NOT DETECTED Final   Vibrio species NOT DETECTED NOT DETECTED Final   Vibrio cholerae NOT DETECTED NOT DETECTED Final   Enteroaggregative E coli (EAEC) NOT DETECTED NOT  DETECTED Final   Enteropathogenic E coli (EPEC) NOT DETECTED NOT DETECTED Final   Enterotoxigenic E coli (ETEC) NOT DETECTED NOT DETECTED Final   Shiga like toxin producing E coli (STEC) NOT DETECTED NOT DETECTED Final   Shigella/Enteroinvasive E coli (EIEC) NOT DETECTED NOT DETECTED Final   Cryptosporidium NOT DETECTED NOT DETECTED Final   Cyclospora cayetanensis NOT DETECTED NOT DETECTED Final   Entamoeba  histolytica NOT DETECTED NOT DETECTED Final   Giardia lamblia NOT DETECTED NOT DETECTED Final   Adenovirus F40/41 NOT DETECTED NOT DETECTED Final   Astrovirus NOT DETECTED NOT DETECTED Final   Norovirus GI/GII NOT DETECTED NOT DETECTED Final   Rotavirus A NOT DETECTED NOT DETECTED Final   Sapovirus (I, II, IV, and V) NOT DETECTED NOT DETECTED Final    Comment: Performed at [facility], [address] [place], [place] [address]   Ct abdomen reviewed personally Radiographs and labs were personally reviewed by me.   Assessment and Plan 47 y.o. female with h/o ulcerative colitis s/p proctocolectomy in 1997 with creation of J pouch, pouchitis followed by GI at [facility] on chronic suppressive cipro was admitted with sudden onset fever of 102. Pt says this fever was different than her pouchitis fever. She broke the fever within a day with cipro and flagyl. She was being discharged when she asked  to see ID.  Fever - could be a respiratory viral illness, VS pouchitis flare up.  No pustules or exudate pharynx to suggest Strep A. Will check RESP viral PCR and group A strep. Gi panel was neg, Cdiff neg  Fever has resolved now- so she could go home and will contact her with the labs  Pouchitis- chronic - Jpouch inflammation on cipro and intermittent flagyl Followed at [facility] s/p proctocolectomy in 1997 with J pouch creation  Discussed the management with the patient and answered her questions  Tsosie Billing, MD  05/29/2018, 2:09 PM

## 2018-05-30 ENCOUNTER — Telehealth: Payer: Self-pay | Admitting: Behavioral Health

## 2018-05-30 ENCOUNTER — Telehealth: Payer: Self-pay

## 2018-05-30 ENCOUNTER — Telehealth: Payer: Self-pay | Admitting: Infectious Diseases

## 2018-05-30 NOTE — Telephone Encounter (Signed)
Patient called attempting to get contact information for Dr. Delaine Lame.  Informed her this was the [facility] for Infectious disease.  Gave the office number listed for Dr, Delaine Lame.  Patient would like a call back for Hospital follow up. Pricilla Riffle RN

## 2018-05-30 NOTE — Telephone Encounter (Signed)
Pt called to says her husband works at [employer] and was asked to get a mumps vaccine booster because of [place] students having mumps. He is asymptomatic- pt concerned that she could have mumps. reasured ehr that she cannot get one if her husband does not have symptoms.She [facility]. Called and left message to go to [facility] if she was concerned But she had gone to [facility] and was not seen. She says she is feeling okay

## 2018-05-30 NOTE — Telephone Encounter (Signed)
Transition Care Management Follow-up Telephone Call  Date of discharge and from where: [facility] on 05/29/18.  How have you been since you were released from the hospital? Still feels bad, throat is still swollen and painful. Face and lips are still swollen. The inside of her cheeks are raw and it hurts to eat. Pt feels weak and has had some nausea. Declines fever or v/d. Pt is eating and drinking to keep dehydrated. Pt states her husband is going to take her to the [facility] clinic today.  Any questions or concerns? Yes, pt is concerned that she has the mumps.   Items Reviewed:  Did the pt receive and understand the discharge instructions provided? Yes   Medications obtained and verified? No, pt declined.   Any new allergies since your discharge? No   Dietary orders reviewed? N/A  Do you have support at home? Yes, when husband is not working.    Other (ie: DME, Home Health, etc) N/A  Functional Questionnaire: (I = Independent and D = Dependent)  Bathing/Dressing- I   Meal Prep- I  Eating- I  Maintaining continence- I  Transferring/Ambulation- I  Managing Meds- I   Follow up appointments reviewed:    PCP Hospital f/u appt confirmed? No, pt declined coming in for a HFU apt. Apt cancelled. Pt states her husband is going to take her to the [facility] today to be tested for Mumps.   [facility] f/u appt confirmed? N/A   Are transportation arrangements needed? No   If their condition worsens, is the pt aware to call  their PCP or go to the ED? Yes  Was the patient provided with contact information for the PCP's office or ED? Yes  Was the pt encouraged to call back with questions or concerns? Yes

## 2018-06-01 LAB — CULTURE, BLOOD (ROUTINE X 2)
CULTURE: NO GROWTH
Culture: NO GROWTH

## 2018-06-02 ENCOUNTER — Other Ambulatory Visit: Payer: Self-pay | Admitting: Physician Assistant

## 2018-06-02 DIAGNOSIS — J45990 Exercise induced bronchospasm: Secondary | ICD-10-CM

## 2018-06-02 DIAGNOSIS — T782XXD Anaphylactic shock, unspecified, subsequent encounter: Secondary | ICD-10-CM

## 2018-06-02 LAB — RESPIRATORY PANEL BY PCR
ADENOVIRUS-RVPPCR: UNDETERMINED
Bordetella pertussis: UNDETERMINED
CORONAVIRUS 229E-RVPPCR: UNDETERMINED
CORONAVIRUS HKU1-RVPPCR: UNDETERMINED
CORONAVIRUS OC43-RVPPCR: UNDETERMINED
Chlamydophila pneumoniae: UNDETERMINED
Coronavirus NL63: UNDETERMINED
INFLUENZA A H1 2009-RVPPR: UNDETERMINED
INFLUENZA A H3-RVPPCR: UNDETERMINED
Influenza A H1: UNDETERMINED
Influenza A: UNDETERMINED
Influenza B: UNDETERMINED
MYCOPLASMA PNEUMONIAE-RVPPCR: UNDETERMINED
Metapneumovirus: UNDETERMINED
Parainfluenza Virus 1: UNDETERMINED
Parainfluenza Virus 2: UNDETERMINED
Parainfluenza Virus 3: UNDETERMINED
Parainfluenza Virus 4: UNDETERMINED
RHINOVIRUS / ENTEROVIRUS - RVPPCR: UNDETERMINED
Respiratory Syncytial Virus: UNDETERMINED

## 2018-06-02 MED ORDER — ALBUTEROL SULFATE HFA 108 (90 BASE) MCG/ACT IN AERS: 2.0000 | INHALATION_SPRAY | Freq: Four times a day (QID) | RESPIRATORY_TRACT | 2 refills | Status: DC | PRN

## 2018-06-02 MED ORDER — EPINEPHRINE 0.3 MG/0.3ML IJ SOAJ: 0.3000 mg | Freq: Once | INTRAMUSCULAR | 0 refills | Status: AC

## 2018-06-02 MED ORDER — BECLOMETHASONE DIPROPIONATE 40 MCG/ACT IN AERS: 2.0000 | INHALATION_SPRAY | Freq: Two times a day (BID) | RESPIRATORY_TRACT | 2 refills | Status: DC

## 2018-06-02 NOTE — Telephone Encounter (Signed)
Needs refill on  Belcomethasone 40 MCG  Albuterol inhaler  Epipen  [facility]  [address]  Pt call back [phone]  Thanks teri

## 2018-06-04 ENCOUNTER — Inpatient Hospital Stay: Payer: BLUE CROSS/BLUE SHIELD | Admitting: Physician Assistant

## 2018-06-18 DIAGNOSIS — I8312 Varicose veins of left lower extremity with inflammation: Secondary | ICD-10-CM | POA: Diagnosis not present

## 2018-06-25 DIAGNOSIS — I8312 Varicose veins of left lower extremity with inflammation: Secondary | ICD-10-CM | POA: Diagnosis not present

## 2018-06-27 DIAGNOSIS — I8312 Varicose veins of left lower extremity with inflammation: Secondary | ICD-10-CM | POA: Diagnosis not present

## 2018-07-03 DIAGNOSIS — Z1283 Encounter for screening for malignant neoplasm of skin: Secondary | ICD-10-CM | POA: Diagnosis not present

## 2018-07-03 DIAGNOSIS — D485 Neoplasm of uncertain behavior of skin: Secondary | ICD-10-CM | POA: Diagnosis not present

## 2018-07-03 DIAGNOSIS — L578 Other skin changes due to chronic exposure to nonionizing radiation: Secondary | ICD-10-CM | POA: Diagnosis not present

## 2018-07-03 DIAGNOSIS — Z808 Family history of malignant neoplasm of other organs or systems: Secondary | ICD-10-CM | POA: Diagnosis not present

## 2018-07-31 ENCOUNTER — Other Ambulatory Visit: Payer: Self-pay | Admitting: Physician Assistant

## 2018-07-31 DIAGNOSIS — F32A Depression, unspecified: Secondary | ICD-10-CM

## 2018-07-31 DIAGNOSIS — F329 Major depressive disorder, single episode, unspecified: Secondary | ICD-10-CM

## 2018-08-25 ENCOUNTER — Telehealth: Payer: Self-pay | Admitting: Physician Assistant

## 2018-08-25 DIAGNOSIS — E038 Other specified hypothyroidism: Secondary | ICD-10-CM

## 2018-08-25 DIAGNOSIS — E039 Hypothyroidism, unspecified: Secondary | ICD-10-CM

## 2018-08-25 DIAGNOSIS — F329 Major depressive disorder, single episode, unspecified: Secondary | ICD-10-CM

## 2018-08-25 DIAGNOSIS — F32A Depression, unspecified: Secondary | ICD-10-CM

## 2018-08-25 MED ORDER — SERTRALINE HCL 25 MG PO TABS: 25.0000 mg | ORAL_TABLET | Freq: Three times a day (TID) | ORAL | 1 refills | Status: DC

## 2018-08-25 MED ORDER — LEVOTHYROXINE SODIUM 50 MCG PO TABS: 50.0000 ug | ORAL_TABLET | Freq: Every day | ORAL | 1 refills | Status: DC

## 2018-08-25 NOTE — Telephone Encounter (Signed)
Patient needs refill on Levothyroxine 50 mcg and Zoloft 25 mg.   [facility] [address]

## 2018-08-25 NOTE — Telephone Encounter (Signed)
refilled 

## 2018-08-25 NOTE — Telephone Encounter (Signed)
Patient called back to see if refill has been sent to pharmacy yet.

## 2018-09-10 ENCOUNTER — Other Ambulatory Visit: Payer: Self-pay | Admitting: Physician Assistant

## 2018-09-10 DIAGNOSIS — E46 Unspecified protein-calorie malnutrition: Secondary | ICD-10-CM

## 2018-10-02 ENCOUNTER — Encounter: Payer: BLUE CROSS/BLUE SHIELD | Admitting: Physician Assistant

## 2018-10-29 ENCOUNTER — Other Ambulatory Visit: Payer: Self-pay | Admitting: Physician Assistant

## 2018-10-29 DIAGNOSIS — Z9109 Other allergy status, other than to drugs and biological substances: Secondary | ICD-10-CM

## 2018-10-31 ENCOUNTER — Other Ambulatory Visit: Payer: Self-pay | Admitting: Physician Assistant

## 2018-10-31 ENCOUNTER — Ambulatory Visit (INDEPENDENT_AMBULATORY_CARE_PROVIDER_SITE_OTHER): Payer: BLUE CROSS/BLUE SHIELD | Admitting: Physician Assistant

## 2018-10-31 ENCOUNTER — Encounter: Payer: Self-pay | Admitting: Physician Assistant

## 2018-10-31 VITALS — BP 106/72 | HR 76 | Temp 97.6°F | Resp 16 | Wt 124.4 lb

## 2018-10-31 DIAGNOSIS — K51019 Ulcerative (chronic) pancolitis with unspecified complications: Secondary | ICD-10-CM

## 2018-10-31 DIAGNOSIS — Z0000 Encounter for general adult medical examination without abnormal findings: Secondary | ICD-10-CM

## 2018-10-31 DIAGNOSIS — E46 Unspecified protein-calorie malnutrition: Secondary | ICD-10-CM | POA: Diagnosis not present

## 2018-10-31 DIAGNOSIS — J4540 Moderate persistent asthma, uncomplicated: Secondary | ICD-10-CM | POA: Diagnosis not present

## 2018-10-31 DIAGNOSIS — Z1239 Encounter for other screening for malignant neoplasm of breast: Secondary | ICD-10-CM

## 2018-10-31 DIAGNOSIS — J45990 Exercise induced bronchospasm: Secondary | ICD-10-CM

## 2018-10-31 DIAGNOSIS — Z114 Encounter for screening for human immunodeficiency virus [HIV]: Secondary | ICD-10-CM | POA: Diagnosis not present

## 2018-10-31 DIAGNOSIS — M62838 Other muscle spasm: Secondary | ICD-10-CM | POA: Diagnosis not present

## 2018-10-31 DIAGNOSIS — K91850 Pouchitis: Secondary | ICD-10-CM

## 2018-10-31 DIAGNOSIS — E039 Hypothyroidism, unspecified: Secondary | ICD-10-CM | POA: Diagnosis not present

## 2018-10-31 DIAGNOSIS — Z9049 Acquired absence of other specified parts of digestive tract: Secondary | ICD-10-CM | POA: Diagnosis not present

## 2018-10-31 MED ORDER — BECLOMETHASONE DIPROPIONATE 40 MCG/ACT IN AERS: 2.0000 | INHALATION_SPRAY | Freq: Two times a day (BID) | RESPIRATORY_TRACT | 2 refills | Status: DC

## 2018-10-31 MED ORDER — CYCLOBENZAPRINE HCL 10 MG PO TABS: 10.0000 mg | ORAL_TABLET | Freq: Every evening | ORAL | 1 refills | Status: DC | PRN

## 2018-10-31 NOTE — Progress Notes (Signed)
Patient: Mariah Nguyen, Female    DOB: May 26, 1971, 48 y.o.   MRN: [id number] Visit Date: 10/31/2018  Today's Provider: Mar Daring, PA-C   Chief Complaint  Patient presents with  . Annual Exam   Subjective:     Annual physical exam Mariah Nguyen is a 48 y.o. female who presents today for health maintenance and complete physical. She feels fairly well. She reports exercising. She reports she is sleeping fairly well.  Reports that she has been having bronchitis and has been using her albuterol inhaler much more. -----------------------------------------------------------------   Review of Systems  Constitutional: Negative.   HENT: Positive for nosebleeds and postnasal drip.   Eyes: Positive for redness.  Respiratory: Negative.        "Asthma"  Cardiovascular: Negative.        Leg vein, procedure (medically Necessary-Varicose)  Gastrointestinal: Negative.        Chronic pouchitis  Endocrine: Positive for cold intolerance and heat intolerance.  Genitourinary: Negative.   Musculoskeletal: Positive for arthralgias and back pain.  Skin: Negative.   Allergic/Immunologic: Negative.   Neurological: Negative.   Hematological: Negative.   Psychiatric/Behavioral: Negative.     Social History      She  reports that she has never smoked. She has never used smokeless tobacco. She reports current alcohol use. She reports that she does not use drugs.       Social History   Socioeconomic History  . Marital status: Married    Spouse name: Not on file  . Number of children: Not on file  . Years of education: Not on file  . Highest education level: Not on file  Occupational History  . Not on file  Social Needs  . Financial resource strain: Not on file  . Food insecurity:    Worry: Not on file    Inability: Not on file  . Transportation needs:    Medical: Not on file    Non-medical: Not on file  Tobacco Use  . Smoking status: Never Smoker  . Smokeless  tobacco: Never Used  Substance and Sexual Activity  . Alcohol use: Yes    Alcohol/week: 0.0 standard drinks    Comment: Occasionally  . Drug use: No  . Sexual activity: Not on file  Lifestyle  . Physical activity:    Days per week: Not on file    Minutes per session: Not on file  . Stress: Not on file  Relationships  . Social connections:    Talks on phone: Not on file    Gets together: Not on file    Attends religious service: Not on file    Active member of club or organization: Not on file    Attends meetings of clubs or organizations: Not on file    Relationship status: Not on file  Other Topics Concern  . Not on file  Social History Narrative  . Not on file    Past Medical History:  Diagnosis Date  . Exercise-induced asthma   . Ulcerative colitis ([place]) 1996     Patient Active Problem List   Diagnosis Date Noted  . Pouchitis ([place]) 05/27/2018  . 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 Shoulder pain 10/07/2015  . Subclinical hypothyroidism 09/27/2015  . Asthma 06/09/2015  . Anxiety 06/09/2015  . Clinical depression 06/09/2015  . Cannot sleep 06/09/2015  . Migraine 06/09/2015  . Exercise-induced asthma with acute exacerbation 06/09/2015  . Beat, premature ventricular 01/21/2014  . Bowel  disease, inflammatory 10/19/2013  . Family history of breast cancer 02/19/2013  . 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 Allergy to environmental factors 07/01/2012  . Ileal pouchitis ([place]) 06/22/2011    Past Surgical History:  Procedure Laterality Date  . CESAREAN SECTION  2002  . PARTIAL HYSTERECTOMY  08/2007  . Right eye surgery  07/15/12   [facility]  . Stripping of varicose veins in legs    . TONSILLECTOMY AND ADENOIDECTOMY  01/2005  . Total proctocolectomy with J-pouch  1997   History of recurrent pouchitis    Family History        No family status information on file.        Her family history is not on file.      Allergies  Allergen Reactions    . Bentyl  [Dicyclomine]   . Flagyl [Metronidazole]     reportedly has had "nerve damage" in the past, but is prescribed Flagyl by GI doctor at [facility] (2019)  . Meperidine   . Tinidazole Nausea And Vomiting  . Demerol [Meperidine Hcl] Rash  . Povidone Iodine Rash  . Sulfa Antibiotics Rash     Current Outpatient Medications:  .  acetaminophen (TYLENOL) 325 MG tablet, Take 650 mg by mouth every 6 (six) hours as needed for mild pain., Disp: , Rfl:  .  albuterol (VENTOLIN HFA) 108 (90 Base) MCG/ACT inhaler, Inhale 2 puffs into the lungs every 6 (six) hours as needed for wheezing or shortness of breath., Disp: 1 Inhaler, Rfl: 2 .  ALPRAZolam (XANAX) 0.5 MG tablet, Take 1 tablet (0.5 mg total) by mouth 2 (two) times daily as needed for anxiety., Disp: 60 tablet, Rfl: 5 .  beclomethasone (QVAR) 40 MCG/ACT inhaler, Inhale 2 puffs into the lungs 2 (two) times daily., Disp: 1 Inhaler, Rfl: 2 .  cetirizine (ZYRTEC ALLERGY) 10 MG tablet, Take 10 mg by mouth daily. , Disp: , Rfl:  .  ciprofloxacin (CIPRO) 500 MG tablet, Take 500 mg by mouth 2 (two) times daily. , Disp: , Rfl:  .  cyclobenzaprine (FLEXERIL) 10 MG tablet, Take 1 tablet (10 mg total) by mouth at bedtime as needed for muscle spasms., Disp: 90 tablet, Rfl: 1 .  cycloSPORINE (RESTASIS) 0.05 % ophthalmic emulsion, Place 1 drop into both eyes 2 (two) times daily. , Disp: , Rfl:  .  levothyroxine (SYNTHROID, LEVOTHROID) 50 MCG tablet, Take 1 tablet (50 mcg total) by mouth daily before breakfast., Disp: 90 tablet, Rfl: 1 .  metroNIDAZOLE (FLAGYL) 500 MG tablet, Take 250 mg by mouth 2 (two) times daily. , Disp: , Rfl: 0 .  montelukast (SINGULAIR) 10 MG tablet, TAKE 1 TABLET BY MOUTH EVERY DAY AS DIRECTED, Disp: 90 tablet, Rfl: 1 .  Nutritional Supplements (QUINOA KALE & HEMP) LIQD, Place 10 drops under the tongue 2 (two) times daily., Disp: , Rfl:  .  Potassium 99 MG TABS, Take 1 tablet by mouth daily., Disp: , Rfl:  .  promethazine (PHENERGAN) 25  MG tablet, Take 1 tablet (25 mg total) by mouth every 4 (four) hours as needed., Disp: 30 tablet, Rfl: 5 .  sertraline (ZOLOFT) 25 MG tablet, Take 1 tablet (25 mg total) by mouth 3 (three) times daily., Disp: 270 tablet, Rfl: 1 .  Simethicone 180 MG CAPS, Take 180 mg by mouth 2 (two) times daily., Disp: , Rfl:  .  terconazole (TERAZOL 3) 0.8 % vaginal cream, Insert vaginally once weekly, Disp: 20 g, Rfl: 3 .  traZODone (DESYREL) 100  MG tablet, TAKE 2 TABLETS (200 MG TOTAL) BY MOUTH AT BEDTIME., Disp: 180 tablet, Rfl: 1 .  Vitamin D, Ergocalciferol, (DRISDOL) 1.25 MG (50000 UT) CAPS capsule, TAKE 1 CAPSULE (50,000 UNITS TOTAL) BY MOUTH EVERY 7 (SEVEN) DAYS., Disp: 12 capsule, Rfl: 2 .  Cranberry Fruit 405 MG CAPS, Take 1 capsule by mouth daily. , Disp: , Rfl:  .  diphenoxylate-atropine (LOMOTIL) 2.5-0.025 MG tablet, Take 1 tablet by mouth 4 (four) times daily as needed for diarrhea or loose stools. , Disp: , Rfl:  .  ferrous sulfate 325 (65 FE) MG tablet, Take 325 mg by mouth daily with breakfast., Disp: , Rfl:  .  NYSTATIN powder, APPLY TO AFFECTED AREA TWICE A DAY (Patient not taking: Reported on 05/27/2018), Disp: 60 g, Rfl: 0   Patient Care Team: Mar Daring, PA-C as PCP - General (Family Medicine)    Objective:    Vitals: BP 106/72 (BP Location: Left Arm, Patient Position: Sitting, Cuff Size: Normal)   Pulse 76   Temp 97.6 F (36.4 C) (Oral)   Resp 16   Wt 124 lb 6.4 oz (56.4 kg)   SpO2 96%   BMI 22.04 kg/m    Vitals:   10/31/18 1407  BP: 106/72  Pulse: 76  Resp: 16  Temp: 97.6 F (36.4 C)  TempSrc: Oral  SpO2: 96%  Weight: 124 lb 6.4 oz (56.4 kg)     Physical Exam Vitals signs reviewed.  Constitutional:      General: She is not in acute distress.    Appearance: Normal appearance. She is well-developed and normal weight. She is not ill-appearing or diaphoretic.  HENT:     Head: Normocephalic and atraumatic.     Right Ear: Tympanic membrane, ear canal and  external ear normal.     Left Ear: Tympanic membrane, ear canal and external ear normal.     Nose: Nose normal.     Mouth/Throat:     Mouth: Mucous membranes are moist.     Pharynx: Oropharynx is clear. No oropharyngeal exudate.  Eyes:     General: No scleral icterus.       Right eye: No discharge.        Left eye: No discharge.     Extraocular Movements: Extraocular movements intact.     Conjunctiva/sclera: Conjunctivae normal.     Pupils: Pupils are equal, round, and reactive to light.  Neck:     Musculoskeletal: Normal range of motion and neck supple.     Thyroid: No thyromegaly.     Vascular: No JVD.     Trachea: No tracheal deviation.  Cardiovascular:     Rate and Rhythm: Normal rate and regular rhythm.     Pulses: Normal pulses.     Heart sounds: Normal heart sounds. No murmur. No friction rub. No gallop.   Pulmonary:     Effort: Pulmonary effort is normal. No respiratory distress.     Breath sounds: Normal breath sounds. No wheezing or rales.  Chest:     Chest wall: No tenderness.  Abdominal:     General: Bowel sounds are normal. There is no distension.     Palpations: Abdomen is soft. There is no mass.     Tenderness: There is no abdominal tenderness. There is no guarding or rebound.  Musculoskeletal: Normal range of motion.        General: No tenderness.  Lymphadenopathy:     Cervical: No cervical adenopathy.  Skin:    General: Skin  is warm and dry.     Findings: No rash.  Neurological:     Mental Status: She is alert and oriented to person, place, and time.  Psychiatric:        Mood and Affect: Mood normal.        Behavior: Behavior normal.        Thought Content: Thought content normal.        Judgment: Judgment normal.      Depression Screen PHQ 2/9 Scores 10/31/2018 09/30/2017  PHQ - 2 Score 0 0  PHQ- 9 Score 0 2       Assessment & Plan:     Routine Health Maintenance and Physical Exam  Exercise Activities and Dietary recommendations Goals     None     Immunization History  Administered Date(s) Administered  . Influenza Split 07/18/2005, 05/22/2013  . Influenza,inj,Quad PF,6+ Mos 06/12/2014, 07/14/2015, 06/05/2016  . Influenza-Unspecified 06/20/2018  . Pneumococcal Polysaccharide-23 07/14/2015  . Td 01/22/2011  . Tdap 01/22/2011    Health Maintenance  Topic Date Due  . HIV Screening  05/15/1984  . TETANUS/TDAP  01/21/2021  . INFLUENZA VACCINE  Completed     Discussed health benefits of physical activity, and encouraged her to engage in regular exercise appropriate for her age and condition.    1. Annual physical exam Normal physical exam today. Will check labs as below and f/u pending lab results. If labs are stable and WNL she will not need to have these rechecked for one year at her next annual physical exam. She is to call the office in the meantime if she has any acute issue, questions or concerns. - CBC with Differential/Platelet - Comprehensive metabolic panel - Lipid panel - TSH - Hemoglobin A1c  2. Breast cancer screening Breast exam today was normal. There is no family history of breast cancer. She does perform regular self breast exams. Mammogram was ordered as below. Information for [facility] was given to patient so she may schedule her mammogram at her convenience.  3. Muscle spasm Stable. Diagnosis pulled for medication refill. Continue current medical treatment plan. - cyclobenzaprine (FLEXERIL) 10 MG tablet; Take 1 tablet (10 mg total) by mouth at bedtime as needed for muscle spasms.  Dispense: 90 tablet; Refill: 1  4. Moderate persistent asthma without complication Stable. Diagnosis pulled for medication refill. Continue current medical treatment plan.  5. Exercise-induced asthma with acute exacerbation Stable. Diagnosis pulled for medication refill. Continue current medical treatment plan. - beclomethasone (QVAR) 40 MCG/ACT inhaler; Inhale 2 puffs into the lungs 2 (two) times  daily.  Dispense: 1 Inhaler; Refill: 2  6. Screening for HIV without presence of risk factors Will check labs as below and f/u pending results. - HIV Antibody (routine testing w rflx)  7. Pouchitis ([place]) Has been stable currently. Will check labs as below and f/u pending results. - Fe+TIBC+Fer - Magnesium - Vitamin D (25 hydroxy)  8. Chronic ulcerative enterocolitis, unspecified complication ([place]) See above medical treatment plan. - Fe+TIBC+Fer - Magnesium - Vitamin D (25 hydroxy)  --------------------------------------------------------------------    Mar Daring, PA-C  [facility]

## 2018-10-31 NOTE — Patient Instructions (Signed)

## 2018-11-01 LAB — COMPREHENSIVE METABOLIC PANEL
ALBUMIN: 4 g/dL (ref 3.8–4.8)
ALT: 14 IU/L (ref 0–32)
AST: 19 IU/L (ref 0–40)
Albumin/Globulin Ratio: 1.5 (ref 1.2–2.2)
Alkaline Phosphatase: 71 IU/L (ref 39–117)
BUN / CREAT RATIO: 11 (ref 9–23)
BUN: 9 mg/dL (ref 6–24)
Bilirubin Total: <0.2 mg/dL (ref 0.0–1.2)
CALCIUM: 9.3 mg/dL (ref 8.7–10.2)
CHLORIDE: 102 mmol/L (ref 96–106)
CO2: 23 mmol/L (ref 20–29)
CREATININE: 0.83 mg/dL (ref 0.57–1.00)
GFR calc non Af Amer: 83 mL/min/{1.73_m2} (ref >59)
GFR, EST AFRICAN AMERICAN: 96 mL/min/{1.73_m2} (ref >59)
GLUCOSE: 85 mg/dL (ref 65–99)
Globulin, Total: 2.7 g/dL (ref 1.5–4.5)
Potassium: 4.1 mmol/L (ref 3.5–5.2)
Sodium: 139 mmol/L (ref 134–144)
TOTAL PROTEIN: 6.7 g/dL (ref 6.0–8.5)

## 2018-11-01 LAB — CBC WITH DIFFERENTIAL/PLATELET
BASOS ABS: 0.1 10*3/uL (ref 0.0–0.2)
BASOS: 1 %
EOS (ABSOLUTE): 0.2 10*3/uL (ref 0.0–0.4)
Eos: 2 %
HEMOGLOBIN: 13.3 g/dL (ref 11.1–15.9)
Hematocrit: 40.2 % (ref 34.0–46.6)
IMMATURE GRANS (ABS): 0 10*3/uL (ref 0.0–0.1)
IMMATURE GRANULOCYTES: 1 %
Lymphocytes Absolute: 1.9 10*3/uL (ref 0.7–3.1)
Lymphs: 26 %
MCH: 29.2 pg (ref 26.6–33.0)
MCHC: 33.1 g/dL (ref 31.5–35.7)
MCV: 88 fL (ref 79–97)
MONOCYTES: 7 %
Monocytes Absolute: 0.5 10*3/uL (ref 0.1–0.9)
NEUTROS ABS: 4.6 10*3/uL (ref 1.4–7.0)
Neutrophils: 63 %
Platelets: 230 10*3/uL (ref 150–450)
RBC: 4.55 x10E6/uL (ref 3.77–5.28)
RDW: 13.4 % (ref 11.7–15.4)
WBC: 7.3 10*3/uL (ref 3.4–10.8)

## 2018-11-01 LAB — LIPID PANEL
CHOLESTEROL TOTAL: 216 mg/dL — AB (ref 100–199)
Chol/HDL Ratio: 4.2 ratio (ref 0.0–4.4)
HDL: 52 mg/dL (ref >39)
LDL Calculated: 116 mg/dL — ABNORMAL HIGH (ref 0–99)
Triglycerides: 238 mg/dL — ABNORMAL HIGH (ref 0–149)
VLDL CHOLESTEROL CAL: 48 mg/dL — AB (ref 5–40)

## 2018-11-01 LAB — HIV ANTIBODY (ROUTINE TESTING W REFLEX): HIV Screen 4th Generation wRfx: NONREACTIVE

## 2018-11-01 LAB — IRON,TIBC AND FERRITIN PANEL
Ferritin: 45 ng/mL (ref 15–150)
Iron Saturation: 22 % (ref 15–55)
Iron: 67 ug/dL (ref 27–159)
TIBC: 307 ug/dL (ref 250–450)
UIBC: 240 ug/dL (ref 131–425)

## 2018-11-01 LAB — VITAMIN D 25 HYDROXY (VIT D DEFICIENCY, FRACTURES): Vit D, 25-Hydroxy: 55.1 ng/mL (ref 30.0–100.0)

## 2018-11-01 LAB — TSH: TSH: 0.858 u[IU]/mL (ref 0.450–4.500)

## 2018-11-01 LAB — MAGNESIUM: Magnesium: 2.1 mg/dL (ref 1.6–2.3)

## 2018-11-01 LAB — HEMOGLOBIN A1C
ESTIMATED AVERAGE GLUCOSE: 100 mg/dL
HEMOGLOBIN A1C: 5.1 % (ref 4.8–5.6)

## 2018-11-05 ENCOUNTER — Telehealth: Payer: Self-pay | Admitting: *Deleted

## 2018-11-05 NOTE — Telephone Encounter (Signed)
Pt advised.   Thanks,   -Laura  

## 2018-11-05 NOTE — Telephone Encounter (Signed)
LMOVM for pt to return call 

## 2018-11-05 NOTE — Telephone Encounter (Signed)
-----   Message from Mar Daring, [place] sent at 11/04/2018  5:37 PM EST ----- Iron is normal. Blood count is normal. Kidney and liver function is normal. Sugar is normal. Cholesterol borderline high. Slight increase from last year but this is most likely from non fasting, thus essentially stable. Thyroid is normal. Vit D is normal. Magnesium is normal.

## 2018-11-07 DIAGNOSIS — K529 Noninfective gastroenteritis and colitis, unspecified: Secondary | ICD-10-CM | POA: Diagnosis not present

## 2018-11-07 DIAGNOSIS — Z9049 Acquired absence of other specified parts of digestive tract: Secondary | ICD-10-CM | POA: Diagnosis not present

## 2018-11-07 DIAGNOSIS — K91850 Pouchitis: Secondary | ICD-10-CM | POA: Diagnosis not present

## 2018-11-07 DIAGNOSIS — F419 Anxiety disorder, unspecified: Secondary | ICD-10-CM | POA: Diagnosis not present

## 2018-11-12 ENCOUNTER — Telehealth: Payer: Self-pay | Admitting: Physician Assistant

## 2018-11-12 DIAGNOSIS — Z23 Encounter for immunization: Secondary | ICD-10-CM

## 2018-11-12 MED ORDER — PNEUMOCOCCAL VAC POLYVALENT 25 MCG/0.5ML IJ INJ: 0.5000 mL | INJECTION | INTRAMUSCULAR | 0 refills | Status: AC

## 2018-11-12 NOTE — Telephone Encounter (Signed)
Please advise 

## 2018-11-12 NOTE — Telephone Encounter (Signed)
[facility] [address]  Please call with order.  Let patient know when done.

## 2018-11-12 NOTE — Telephone Encounter (Signed)
Order sent, but not sure if it will be covered by her insurance since she is not 55 or diabetic

## 2018-11-12 NOTE — Telephone Encounter (Signed)
Patient advised as below.  

## 2018-11-16 ENCOUNTER — Other Ambulatory Visit: Payer: Self-pay | Admitting: Physician Assistant

## 2018-11-16 DIAGNOSIS — G43809 Other migraine, not intractable, without status migrainosus: Secondary | ICD-10-CM

## 2019-01-22 DIAGNOSIS — J301 Allergic rhinitis due to pollen: Secondary | ICD-10-CM | POA: Diagnosis not present

## 2019-02-12 ENCOUNTER — Other Ambulatory Visit: Payer: Self-pay | Admitting: Physician Assistant

## 2019-02-12 DIAGNOSIS — F32A Depression, unspecified: Secondary | ICD-10-CM

## 2019-02-12 DIAGNOSIS — F329 Major depressive disorder, single episode, unspecified: Secondary | ICD-10-CM

## 2019-02-23 DIAGNOSIS — Z03818 Encounter for observation for suspected exposure to other biological agents ruled out: Secondary | ICD-10-CM | POA: Diagnosis not present

## 2019-04-27 ENCOUNTER — Other Ambulatory Visit: Payer: Self-pay | Admitting: Physician Assistant

## 2019-04-27 DIAGNOSIS — Z9109 Other allergy status, other than to drugs and biological substances: Secondary | ICD-10-CM

## 2019-05-10 ENCOUNTER — Other Ambulatory Visit: Payer: Self-pay | Admitting: Physician Assistant

## 2019-05-10 DIAGNOSIS — E038 Other specified hypothyroidism: Secondary | ICD-10-CM

## 2019-05-10 DIAGNOSIS — E039 Hypothyroidism, unspecified: Secondary | ICD-10-CM

## 2019-05-11 ENCOUNTER — Other Ambulatory Visit: Payer: Self-pay | Admitting: Physician Assistant

## 2019-05-11 DIAGNOSIS — F5101 Primary insomnia: Secondary | ICD-10-CM

## 2019-05-27 ENCOUNTER — Other Ambulatory Visit: Payer: Self-pay | Admitting: Physician Assistant

## 2019-05-27 DIAGNOSIS — E46 Unspecified protein-calorie malnutrition: Secondary | ICD-10-CM

## 2019-06-08 ENCOUNTER — Other Ambulatory Visit: Payer: Self-pay | Admitting: Physician Assistant

## 2019-06-08 DIAGNOSIS — F329 Major depressive disorder, single episode, unspecified: Secondary | ICD-10-CM

## 2019-06-08 DIAGNOSIS — F32A Depression, unspecified: Secondary | ICD-10-CM

## 2019-06-09 ENCOUNTER — Telehealth: Payer: Self-pay | Admitting: Physician Assistant

## 2019-06-09 DIAGNOSIS — E034 Atrophy of thyroid (acquired): Secondary | ICD-10-CM

## 2019-06-09 NOTE — Telephone Encounter (Signed)
Does pt need an Office visit?  Thanks,   -Mickel Baas

## 2019-06-09 NOTE — Telephone Encounter (Signed)
Pt called wanting to discuss her thyroid medication.  She says she has been cold, tired and losing hair  CB#  [phone]  Con [place]

## 2019-06-09 NOTE — Telephone Encounter (Signed)
Thyroid lab ordered. She can come at her convenience to have rechecked.

## 2019-06-10 NOTE — Telephone Encounter (Signed)
Left message advising

## 2019-06-26 DIAGNOSIS — Z9049 Acquired absence of other specified parts of digestive tract: Secondary | ICD-10-CM | POA: Diagnosis not present

## 2019-06-26 DIAGNOSIS — K91850 Pouchitis: Secondary | ICD-10-CM | POA: Diagnosis not present

## 2019-06-26 DIAGNOSIS — Z8719 Personal history of other diseases of the digestive system: Secondary | ICD-10-CM | POA: Diagnosis not present

## 2019-06-26 DIAGNOSIS — K529 Noninfective gastroenteritis and colitis, unspecified: Secondary | ICD-10-CM | POA: Diagnosis not present

## 2019-08-04 ENCOUNTER — Other Ambulatory Visit: Payer: Self-pay | Admitting: Physician Assistant

## 2019-08-04 ENCOUNTER — Telehealth: Payer: Self-pay

## 2019-08-04 DIAGNOSIS — J45990 Exercise induced bronchospasm: Secondary | ICD-10-CM

## 2019-08-04 DIAGNOSIS — K91850 Pouchitis: Secondary | ICD-10-CM

## 2019-08-04 DIAGNOSIS — M791 Myalgia, unspecified site: Secondary | ICD-10-CM

## 2019-08-04 DIAGNOSIS — K51019 Ulcerative (chronic) pancolitis with unspecified complications: Secondary | ICD-10-CM

## 2019-08-04 NOTE — Telephone Encounter (Signed)
Copied from [place] [id number]. Topic: General - Inquiry >> Aug 04, 2019  4:19 PM Mathis Bud wrote: Reason for CRM: Patient is requesting a call back regarding lab work.  Patient does have future labs but would like to speak to nurse to discuss more labs patient would like to get  Call back [phone]

## 2019-08-05 ENCOUNTER — Telehealth: Payer: Self-pay

## 2019-08-05 ENCOUNTER — Other Ambulatory Visit: Payer: Self-pay | Admitting: Physician Assistant

## 2019-08-05 DIAGNOSIS — K51019 Ulcerative (chronic) pancolitis with unspecified complications: Secondary | ICD-10-CM | POA: Diagnosis not present

## 2019-08-05 DIAGNOSIS — M791 Myalgia, unspecified site: Secondary | ICD-10-CM | POA: Diagnosis not present

## 2019-08-05 DIAGNOSIS — E034 Atrophy of thyroid (acquired): Secondary | ICD-10-CM | POA: Diagnosis not present

## 2019-08-05 DIAGNOSIS — K91850 Pouchitis: Secondary | ICD-10-CM | POA: Diagnosis not present

## 2019-08-05 NOTE — Telephone Encounter (Signed)
Patient advised.

## 2019-08-05 NOTE — Telephone Encounter (Signed)
Pt called back saying the labs were for anemia, inflammation, thyroid, protein, fatigue, feel dehydrated.  She said Sonia Baller knows about all of these.  She is concerned she might have RA.

## 2019-08-05 NOTE — Addendum Note (Signed)
Addended by: Mar Daring on: 08/05/2019 03:07 PM   Modules accepted: Orders

## 2019-08-05 NOTE — Telephone Encounter (Signed)
From [facility]  Copied from [place] [id number]. Topic: General - Inquiry >> Aug 04, 2019  4:19 PM Mathis Bud wrote: Reason for CRM: Patient is requesting a call back regarding lab work.  Patient does have future labs but would like to speak to nurse to discuss more labs patient would like to get  Call back [phone] >> Aug 05, 2019 11:59 AM Greggory Keen D wrote: Jearld Pies declined making appt with jenny until she does labs.  She said she would need to do the labs first..  >> Aug 05, 2019 11:57 AM Greggory Keen D wrote: Pt called back saying the labs were for anemia, inflammation, thyroid, protein, fatigue, feel dehydrated.  She said Sonia Baller knows about all of these.  She is concerned she might have RA.

## 2019-08-05 NOTE — Telephone Encounter (Signed)
TSH and T4 had previously been ordered. I ordered CBC, CMP, Autoimmune and inflammatory marker panel, and iron panel to add on as well. She can come at her convenience for lab draw.

## 2019-08-05 NOTE — Telephone Encounter (Signed)
LMTCB to see which labs patient is requesting to be added on. Okay for PEC to ask patient.

## 2019-08-07 ENCOUNTER — Telehealth: Payer: Self-pay

## 2019-08-07 LAB — CBC WITH DIFFERENTIAL/PLATELET
Basophils Absolute: 0.1 10*3/uL (ref 0.0–0.2)
Basos: 1 %
EOS (ABSOLUTE): 0.1 10*3/uL (ref 0.0–0.4)
Eos: 2 %
Hematocrit: 43.5 % (ref 34.0–46.6)
Hemoglobin: 14.2 g/dL (ref 11.1–15.9)
Immature Grans (Abs): 0 10*3/uL (ref 0.0–0.1)
Immature Granulocytes: 0 %
Lymphocytes Absolute: 2.1 10*3/uL (ref 0.7–3.1)
Lymphs: 32 %
MCH: 28 pg (ref 26.6–33.0)
MCHC: 32.6 g/dL (ref 31.5–35.7)
MCV: 86 fL (ref 79–97)
Monocytes Absolute: 0.4 10*3/uL (ref 0.1–0.9)
Monocytes: 5 %
Neutrophils Absolute: 3.9 10*3/uL (ref 1.4–7.0)
Neutrophils: 60 %
Platelets: 264 10*3/uL (ref 150–450)
RBC: 5.07 x10E6/uL (ref 3.77–5.28)
RDW: 13.3 % (ref 11.7–15.4)
WBC: 6.7 10*3/uL (ref 3.4–10.8)

## 2019-08-07 LAB — COMPREHENSIVE METABOLIC PANEL
ALT: 15 IU/L (ref 0–32)
AST: 27 IU/L (ref 0–40)
Albumin/Globulin Ratio: 1.8 (ref 1.2–2.2)
Albumin: 4.3 g/dL (ref 3.8–4.8)
Alkaline Phosphatase: 94 IU/L (ref 39–117)
BUN/Creatinine Ratio: 8 — ABNORMAL LOW (ref 9–23)
BUN: 8 mg/dL (ref 6–24)
Bilirubin Total: 0.3 mg/dL (ref 0.0–1.2)
CO2: 23 mmol/L (ref 20–29)
Calcium: 9.6 mg/dL (ref 8.7–10.2)
Chloride: 103 mmol/L (ref 96–106)
Creatinine, Ser: 1.05 mg/dL — ABNORMAL HIGH (ref 0.57–1.00)
GFR calc Af Amer: 72 mL/min/{1.73_m2} (ref >59)
GFR calc non Af Amer: 62 mL/min/{1.73_m2} (ref >59)
Globulin, Total: 2.4 g/dL (ref 1.5–4.5)
Glucose: 84 mg/dL (ref 65–99)
Potassium: 4 mmol/L (ref 3.5–5.2)
Sodium: 142 mmol/L (ref 134–144)
Total Protein: 6.7 g/dL (ref 6.0–8.5)

## 2019-08-07 LAB — IRON,TIBC AND FERRITIN PANEL
Ferritin: 53 ng/mL (ref 15–150)
Iron Saturation: 16 % (ref 15–55)
Iron: 54 ug/dL (ref 27–159)
Total Iron Binding Capacity: 337 ug/dL (ref 250–450)
UIBC: 283 ug/dL (ref 131–425)

## 2019-08-07 LAB — ANA,IFA RA DIAG PNL W/RFLX TIT/PATN
ANA Titer 1: NEGATIVE
Cyclic Citrullin Peptide Ab: 5 units (ref 0–19)
Rheumatoid fact SerPl-aCnc: <10 IU/mL (ref 0.0–13.9)

## 2019-08-07 LAB — T4 AND TSH
T4, Total: 8.8 ug/dL (ref 4.5–12.0)
TSH: 0.735 u[IU]/mL (ref 0.450–4.500)

## 2019-08-07 NOTE — Telephone Encounter (Signed)
-----   Message from Mar Daring, [place] sent at 08/07/2019 12:26 PM EST ----- Iron is normal. Autoimmune and rheumatoid panels were both negative. Blood count is stable. Liver enzymes are normal. Sugar is normal. Sodium, potassium and calcium are normal. Kidney function shows dehydration. Thyroid is normal.

## 2019-08-07 NOTE — Telephone Encounter (Signed)
Wonderful to hear

## 2019-08-07 NOTE — Telephone Encounter (Signed)
Patient advised of results. Patient wanted to let Tawanna Sat know that she figured it out about her dehydration. She states the dehydration probably came from the sweats from menopause since she hasn't had any diarrhea. Patient has been drinking Gatorade, and she states this seems to be helping.

## 2019-09-22 DIAGNOSIS — K130 Diseases of lips: Secondary | ICD-10-CM | POA: Diagnosis not present

## 2019-09-22 DIAGNOSIS — L439 Lichen planus, unspecified: Secondary | ICD-10-CM | POA: Diagnosis not present

## 2019-11-11 ENCOUNTER — Telehealth: Payer: Self-pay | Admitting: Physician Assistant

## 2019-11-11 DIAGNOSIS — E039 Hypothyroidism, unspecified: Secondary | ICD-10-CM

## 2019-11-11 DIAGNOSIS — E038 Other specified hypothyroidism: Secondary | ICD-10-CM

## 2019-11-11 NOTE — Telephone Encounter (Signed)
Tried calling to schedule a CPE no answer.  PEC please schedule a CPE for pt if she calls back.   Thanks,   -Mickel Baas

## 2019-11-11 NOTE — Telephone Encounter (Signed)
Requested Prescriptions  Pending Prescriptions Disp Refills  . levothyroxine (SYNTHROID) 50 MCG tablet [Pharmacy Med Name: LEVOTHYROXINE 50 MCG TABLET] 30 tablet 0    Sig: TAKE 1 TABLET BY MOUTH DAILY BEFORE BREAKFAST     Endocrinology:  Hypothyroid Agents Failed - 11/11/2019  1:44 AM      Failed - TSH needs to be rechecked within 3 months after an abnormal result. Refill until TSH is due.      Failed - Valid encounter within last 12 months    Recent Outpatient Visits          1 year ago Annual physical exam   [facility] [place], [place] Sorrel, [place]   2 years ago Annual physical exam   [facility] [place] M, [place]   3 years ago Subclinical hypothyroidism   [place], Clearnce Sorrel, [place]   3 years ago Vaginal yeast infection   [place], [place], [place]   4 years ago Subclinical hypothyroidism   [place], [place] M, PA-C             Passed - TSH in normal range and within 360 days    TSH  Date Value Ref Range Status  08/05/2019 0.735 0.450 - 4.500 uIU/mL Final

## 2019-12-05 ENCOUNTER — Other Ambulatory Visit: Payer: Self-pay | Admitting: Physician Assistant

## 2019-12-05 DIAGNOSIS — E039 Hypothyroidism, unspecified: Secondary | ICD-10-CM

## 2019-12-05 DIAGNOSIS — E038 Other specified hypothyroidism: Secondary | ICD-10-CM

## 2019-12-09 ENCOUNTER — Other Ambulatory Visit: Payer: Self-pay | Admitting: Physician Assistant

## 2019-12-09 DIAGNOSIS — E039 Hypothyroidism, unspecified: Secondary | ICD-10-CM

## 2019-12-09 DIAGNOSIS — E038 Other specified hypothyroidism: Secondary | ICD-10-CM

## 2019-12-09 NOTE — Telephone Encounter (Signed)
Pt is scheduled for CPE 12/28/2019  Medication Refill - Medication: levothyroxine (SYNTHROID) 50 MCG tablet    Has the patient contacted their pharmacy? Yes.   (Agent: If no, request that the patient contact the pharmacy for the refill.) (Agent: If yes, when and what did the pharmacy advise?)  Preferred Pharmacy (with phone number or street name):  [id number] [address] [address] Phone: [phone]: [phone]    Agent: Please be advised that RX refills may take up to 3 business days. We ask that you follow-up with your pharmacy.

## 2019-12-09 NOTE — Telephone Encounter (Signed)
Requested medication (s) are due for refill today: yes  Requested medication (s) are on the active medication list: yes   Future visit scheduled: yes  Notes to clinic:  Patient has appointment on 12/28/2019. Please advise for another courtesy refill  Requested Prescriptions  Pending Prescriptions Disp Refills   levothyroxine (SYNTHROID) 50 MCG tablet 30 tablet 0    Sig: TAKE 1 TABLET BY MOUTH DAILY BEFORE BREAKFAST      Endocrinology:  Hypothyroid Agents Failed - 12/09/2019  2:25 PM      Failed - TSH needs to be rechecked within 3 months after an abnormal result. Refill until TSH is due.      Failed - Valid encounter within last 12 months    Recent Outpatient Visits           1 year ago Annual physical exam   [facility] [place], Clearnce Sorrel, [place]   2 years ago Annual physical exam   [facility] [place] Malling M, [place]   3 years ago Subclinical hypothyroidism   [place], Clearnce Sorrel, [place]   3 years ago Vaginal yeast infection   [place], [place], [place]   4 years ago Subclinical hypothyroidism   [place], [place] M, PA-C              Passed - TSH in normal range and within 360 days    TSH  Date Value Ref Range Status  08/05/2019 0.735 0.450 - 4.500 uIU/mL Final

## 2019-12-10 MED ORDER — LEVOTHYROXINE SODIUM 50 MCG PO TABS: ORAL_TABLET | ORAL | 0 refills | Status: DC

## 2019-12-22 NOTE — Progress Notes (Deleted)
Complete physical exam    Patient: Mariah Nguyen   DOB: 1971-03-26   49 y.o. Female  MRN: [id number] Visit Date: 12/22/2019  Today's healthcare provider: Mar Daring, PA-C  Subjective:   No chief complaint on file.   Mariah Nguyen is a 49 y.o. female who presents today for a complete physical exam.  She reports consuming a {diet types:17450} diet. {Exercise:19826} She generally feels {well/fairly well/poorly:18703}. She reports sleeping {well/fairly well/poorly:18703}. She {does/does not:200015} have additional problems to discuss today.  HPI  ***  Past Medical History:  Diagnosis Date  . Exercise-induced asthma   . Hx of dysplastic nevus 2003   multiple sites  . Ulcerative colitis ([place]) 1996   Past Surgical History:  Procedure Laterality Date  . CESAREAN SECTION  2002  . PARTIAL HYSTERECTOMY  08/2007  . Right eye surgery  07/15/12   [facility]  . Stripping of varicose veins in legs    . TONSILLECTOMY AND ADENOIDECTOMY  01/2005  . Total proctocolectomy with J-pouch  1997   History of recurrent pouchitis   Social History   Socioeconomic History  . Marital status: Married    Spouse name: Not on file  . Number of children: Not on file  . Years of education: Not on file  . Highest education level: Not on file  Occupational History  . Not on file  Tobacco Use  . Smoking status: Never Smoker  . Smokeless tobacco: Never Used  Substance and Sexual Activity  . Alcohol use: Yes    Alcohol/week: 0.0 standard drinks    Comment: Occasionally  . Drug use: No  . Sexual activity: Not on file  Other Topics Concern  . Not on file  Social History Narrative  . Not on file   Social Determinants of Health   Financial Resource Strain:   . Difficulty of Paying Living Expenses:   Food Insecurity:   . Worried About Charity fundraiser in the Last Year:   . Arboriculturist in the Last Year:   Transportation Needs:   . Film/video editor (Medical):   Marland Kitchen  Lack of Transportation (Non-Medical):   Physical Activity:   . Days of Exercise per Week:   . Minutes of Exercise per Session:   Stress:   . Feeling of Stress :   Social Connections:   . Frequency of Communication with Friends and Family:   . Frequency of Social Gatherings with Friends and Family:   . Attends Religious Services:   . Active Member of Clubs or Organizations:   . Attends Archivist Meetings:   Marland Kitchen Marital Status:   Intimate Partner Violence:   . Fear of Current or Ex-Partner:   . Emotionally Abused:   Marland Kitchen Physically Abused:   . Sexually Abused:    Family Status  Relation Name Status  . Father  (Not Specified)   Family History  Problem Relation Age of Onset  . Melanoma Father    Allergies  Allergen Reactions  . Bentyl  [Dicyclomine]   . Flagyl [Metronidazole]     reportedly has had "nerve damage" in the past, but is prescribed Flagyl by GI doctor at [facility] (2019)  . Meperidine   . Tinidazole Nausea And Vomiting  . Demerol [Meperidine Hcl] Rash  . Povidone Iodine Rash  . Sulfa Antibiotics Rash    Patient Care Team: Rubye Beach as PCP - General (Family Medicine)   Medications: Outpatient Medications Prior to  Visit  Medication Sig  . acetaminophen (TYLENOL) 325 MG tablet Take 650 mg by mouth every 6 (six) hours as needed for mild pain.  Marland Kitchen albuterol (VENTOLIN HFA) 108 (90 Base) MCG/ACT inhaler Inhale 2 puffs into the lungs every 6 (six) hours as needed for wheezing or shortness of breath.  . ALPRAZolam (XANAX) 0.5 MG tablet Take 1 tablet (0.5 mg total) by mouth 2 (two) times daily as needed for anxiety.  . beclomethasone (QVAR) 40 MCG/ACT inhaler Inhale 2 puffs into the lungs 2 (two) times daily.  . cetirizine (ZYRTEC ALLERGY) 10 MG tablet Take 10 mg by mouth daily.   . ciprofloxacin (CIPRO) 500 MG tablet Take 500 mg by mouth 2 (two) times daily.   . Cranberry Fruit 405 MG CAPS Take 1 capsule by mouth daily.   . cyclobenzaprine (FLEXERIL)  10 MG tablet Take 1 tablet (10 mg total) by mouth at bedtime as needed for muscle spasms.  . cycloSPORINE (RESTASIS) 0.05 % ophthalmic emulsion Place 1 drop into both eyes 2 (two) times daily.   . diphenoxylate-atropine (LOMOTIL) 2.5-0.025 MG tablet Take 1 tablet by mouth 4 (four) times daily as needed for diarrhea or loose stools.   . ferrous sulfate 325 (65 FE) MG tablet Take 325 mg by mouth daily with breakfast.  . levothyroxine (SYNTHROID) 50 MCG tablet TAKE 1 TABLET BY MOUTH DAILY BEFORE BREAKFAST  . metroNIDAZOLE (FLAGYL) 500 MG tablet Take 250 mg by mouth 2 (two) times daily.   . montelukast (SINGULAIR) 10 MG tablet TAKE 1 TABLET BY MOUTH EVERY DAY AS DIRECTED  . Nutritional Supplements (QUINOA KALE & HEMP) LIQD Place 10 drops under the tongue 2 (two) times daily.  . NYSTATIN powder APPLY TO AFFECTED AREA TWICE A DAY (Patient not taking: Reported on 05/27/2018)  . Potassium 99 MG TABS Take 1 tablet by mouth daily.  . promethazine (PHENERGAN) 25 MG tablet TAKE 1 TABLET (25 MG TOTAL) BY MOUTH EVERY 4 (FOUR) HOURS AS NEEDED  . sertraline (ZOLOFT) 25 MG tablet TAKE 1 TABLET BY MOUTH THREE TIMES A DAY  . Simethicone 180 MG CAPS Take 180 mg by mouth 2 (two) times daily.  Marland Kitchen terconazole (TERAZOL 3) 0.8 % vaginal cream Insert vaginally once weekly  . traZODone (DESYREL) 100 MG tablet TAKE 2 TABLETS (200 MG TOTAL) BY MOUTH AT BEDTIME.  Marland Kitchen Vitamin D, Ergocalciferol, (DRISDOL) 1.25 MG (50000 UT) CAPS capsule TAKE 1 CAPSULE (50,000 UNITS TOTAL) BY MOUTH EVERY 7 (SEVEN) DAYS.   No facility-administered medications prior to visit.    Review of Systems  {Show previous labs (optional):23779::" "}    Objective:    There were no vitals taken for this visit. {Show previous vital signs (optional):23777::" "}  Physical Exam  ***  Depression Screen  PHQ 2/9 Scores 10/31/2018 09/30/2017  PHQ - 2 Score 0 0  PHQ- 9 Score 0 2    No results found for any visits on 12/28/19.    Assessment & Plan:      Routine Health Maintenance and Physical Exam  Exercise Activities and Dietary recommendations Goals   None     Immunization History  Administered Date(s) Administered  . Influenza Split 07/18/2005, 05/22/2013  . Influenza,inj,Quad PF,6+ Mos 06/12/2014, 07/14/2015, 06/05/2016  . Influenza-Unspecified 06/20/2018  . Pneumococcal Polysaccharide-23 07/14/2015  . Td 01/22/2011  . Tdap 01/22/2011    Health Maintenance  Topic Date Due  . MAMMOGRAM  05/16/2019  . COLONOSCOPY  Never done  . INFLUENZA VACCINE  04/03/2020  . TETANUS/TDAP  01/21/2021  . HIV Screening  Completed    Discussed health benefits of physical activity, and encouraged her to engage in regular exercise appropriate for her age and condition.  ***  No follow-ups on file.     {provider attestation***:1}   Rubye Beach  [facility] [phone] (phone) [phone] (fax)  [place]

## 2019-12-28 ENCOUNTER — Other Ambulatory Visit: Payer: Self-pay | Admitting: Physician Assistant

## 2019-12-28 ENCOUNTER — Encounter: Payer: BLUE CROSS/BLUE SHIELD | Admitting: Physician Assistant

## 2019-12-28 DIAGNOSIS — E038 Other specified hypothyroidism: Secondary | ICD-10-CM

## 2019-12-28 DIAGNOSIS — E039 Hypothyroidism, unspecified: Secondary | ICD-10-CM

## 2019-12-28 MED ORDER — LEVOTHYROXINE SODIUM 50 MCG PO TABS: ORAL_TABLET | ORAL | 0 refills | Status: DC

## 2019-12-28 NOTE — Progress Notes (Signed)
Refilled medications for her

## 2020-01-01 DIAGNOSIS — Z8719 Personal history of other diseases of the digestive system: Secondary | ICD-10-CM | POA: Diagnosis not present

## 2020-01-01 DIAGNOSIS — F419 Anxiety disorder, unspecified: Secondary | ICD-10-CM | POA: Diagnosis not present

## 2020-01-01 DIAGNOSIS — K91850 Pouchitis: Secondary | ICD-10-CM | POA: Diagnosis not present

## 2020-01-01 DIAGNOSIS — Z9049 Acquired absence of other specified parts of digestive tract: Secondary | ICD-10-CM | POA: Diagnosis not present

## 2020-01-04 ENCOUNTER — Other Ambulatory Visit: Payer: Self-pay | Admitting: Physician Assistant

## 2020-01-04 ENCOUNTER — Other Ambulatory Visit: Payer: Self-pay

## 2020-01-04 ENCOUNTER — Ambulatory Visit: Payer: BC Managed Care – PPO | Admitting: Physician Assistant

## 2020-01-04 ENCOUNTER — Encounter: Payer: Self-pay | Admitting: Physician Assistant

## 2020-01-04 VITALS — BP 101/70 | HR 85 | Temp 96.9°F | Ht 63.0 in | Wt 123.0 lb

## 2020-01-04 DIAGNOSIS — F329 Major depressive disorder, single episode, unspecified: Secondary | ICD-10-CM

## 2020-01-04 DIAGNOSIS — K91850 Pouchitis: Secondary | ICD-10-CM

## 2020-01-04 DIAGNOSIS — G43809 Other migraine, not intractable, without status migrainosus: Secondary | ICD-10-CM

## 2020-01-04 DIAGNOSIS — Z0000 Encounter for general adult medical examination without abnormal findings: Secondary | ICD-10-CM

## 2020-01-04 DIAGNOSIS — Z1239 Encounter for other screening for malignant neoplasm of breast: Secondary | ICD-10-CM | POA: Diagnosis not present

## 2020-01-04 DIAGNOSIS — E039 Hypothyroidism, unspecified: Secondary | ICD-10-CM | POA: Diagnosis not present

## 2020-01-04 DIAGNOSIS — F3342 Major depressive disorder, recurrent, in full remission: Secondary | ICD-10-CM

## 2020-01-04 DIAGNOSIS — J45990 Exercise induced bronchospasm: Secondary | ICD-10-CM | POA: Diagnosis not present

## 2020-01-04 DIAGNOSIS — F5101 Primary insomnia: Secondary | ICD-10-CM | POA: Diagnosis not present

## 2020-01-04 DIAGNOSIS — M62838 Other muscle spasm: Secondary | ICD-10-CM

## 2020-01-04 DIAGNOSIS — F419 Anxiety disorder, unspecified: Secondary | ICD-10-CM

## 2020-01-04 DIAGNOSIS — K51019 Ulcerative (chronic) pancolitis with unspecified complications: Secondary | ICD-10-CM

## 2020-01-04 DIAGNOSIS — R7309 Other abnormal glucose: Secondary | ICD-10-CM | POA: Diagnosis not present

## 2020-01-04 DIAGNOSIS — E038 Other specified hypothyroidism: Secondary | ICD-10-CM

## 2020-01-04 DIAGNOSIS — Z9109 Other allergy status, other than to drugs and biological substances: Secondary | ICD-10-CM

## 2020-01-04 DIAGNOSIS — E46 Unspecified protein-calorie malnutrition: Secondary | ICD-10-CM

## 2020-01-04 MED ORDER — TRAZODONE HCL 100 MG PO TABS: 200.0000 mg | ORAL_TABLET | Freq: Every day | ORAL | 1 refills | Status: DC

## 2020-01-04 MED ORDER — VITAMIN D (ERGOCALCIFEROL) 1.25 MG (50000 UNIT) PO CAPS: 50000.0000 [IU] | ORAL_CAPSULE | ORAL | 2 refills | Status: DC

## 2020-01-04 MED ORDER — SERTRALINE HCL 25 MG PO TABS: 25.0000 mg | ORAL_TABLET | Freq: Three times a day (TID) | ORAL | 1 refills | Status: DC

## 2020-01-04 MED ORDER — PROMETHAZINE HCL 25 MG PO TABS: 25.0000 mg | ORAL_TABLET | ORAL | 5 refills | Status: AC | PRN

## 2020-01-04 MED ORDER — BECLOMETHASONE DIPROPIONATE 40 MCG/ACT IN AERS: 2.0000 | INHALATION_SPRAY | Freq: Two times a day (BID) | RESPIRATORY_TRACT | 5 refills | Status: DC

## 2020-01-04 MED ORDER — MONTELUKAST SODIUM 10 MG PO TABS: 10.0000 mg | ORAL_TABLET | Freq: Every day | ORAL | 1 refills | Status: DC

## 2020-01-04 MED ORDER — CYCLOBENZAPRINE HCL 10 MG PO TABS: 10.0000 mg | ORAL_TABLET | Freq: Every evening | ORAL | 1 refills | Status: AC | PRN

## 2020-01-04 NOTE — Progress Notes (Signed)
Complete physical exam   Patient: Mariah Nguyen   DOB: [birth year]   49 y.o. Female  MRN: [id number] Visit Date: 01/04/2020  Today's healthcare provider: Mar Daring, PA-C   Chief Complaint  Patient presents with  . Annual Exam   Subjective    Mariah Nguyen is a 49 y.o. female who presents today for a complete physical exam.  She reports consuming a general diet. Home exercise routine includes walking, lifting weights, house cleaning.. She generally feels fairly well. She reports sleeping well. She does have additional problems to discuss today.  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    Past Medical History:  Diagnosis Date  . Exercise-induced asthma   . Hx of dysplastic nevus 2003   multiple sites  . Ulcerative colitis ([place]) 1996   Past Surgical History:  Procedure Laterality Date  . CESAREAN SECTION  2002  . PARTIAL HYSTERECTOMY  08/2007  . Right eye surgery  07/15/12   [facility]  . Stripping of varicose veins in legs    . TONSILLECTOMY AND ADENOIDECTOMY  01/2005  . Total proctocolectomy with J-pouch  1997   History of recurrent pouchitis   Social History   Socioeconomic History  . Marital status: Married    Spouse name: Not on file  . Number of children: Not on file  . Years of education: Not on file  . Highest education level: Not on file  Occupational History  . Not on file  Tobacco Use  . Smoking status: Never Smoker  . Smokeless tobacco: Never Used  Substance and Sexual Activity  . Alcohol use: Yes    Alcohol/week: 0.0 standard drinks    Comment: Occasionally  . Drug use: No  . Sexual activity: Not on file  Other Topics Concern  . Not on file  Social History Narrative  . Not on file   Social Determinants of Health   Financial Resource Strain:   . Difficulty of Paying Living Expenses:   Food Insecurity:   . Worried About Charity fundraiser in the Last Year:   . Arboriculturist in  the Last Year:   Transportation Needs:   . Film/video editor (Medical):   Marland Kitchen Lack of Transportation (Non-Medical):   Physical Activity:   . Days of Exercise per Week:   . Minutes of Exercise per Session:   Stress:   . Feeling of Stress :   Social Connections:   . Frequency of Communication with Friends and Family:   . Frequency of Social Gatherings with Friends and Family:   . Attends Religious Services:   . Active Member of Clubs or Organizations:   . Attends Archivist Meetings:   Marland Kitchen Marital Status:   Intimate Partner Violence:   . Fear of Current or Ex-Partner:   . Emotionally Abused:   Marland Kitchen Physically Abused:   . Sexually Abused:    Family Status  Relation Name Status  . Father  (Not Specified)   Family History  Problem Relation Age of Onset  . Melanoma Father    Allergies  Allergen Reactions  . Bentyl  [Dicyclomine]   . Flagyl [Metronidazole]     reportedly has had "nerve damage" in the past, but is prescribed Flagyl by GI doctor at [facility] (2019)  . Meperidine   . Tinidazole Nausea And Vomiting  . Demerol [Meperidine Hcl] Rash  . Povidone Iodine Rash  .  Sulfa Antibiotics Rash    Patient Care Team: Mar Daring, PA-C as PCP - General (Family Medicine)   Medications: Outpatient Medications Prior to Visit  Medication Sig  . acetaminophen (TYLENOL) 325 MG tablet Take 650 mg by mouth every 6 (six) hours as needed for mild pain.  Marland Kitchen albuterol (VENTOLIN HFA) 108 (90 Base) MCG/ACT inhaler Inhale 2 puffs into the lungs every 6 (six) hours as needed for wheezing or shortness of breath.  . ALPRAZolam (XANAX) 0.5 MG tablet Take 1 tablet (0.5 mg total) by mouth 2 (two) times daily as needed for anxiety.  . beclomethasone (QVAR) 40 MCG/ACT inhaler Inhale 2 puffs into the lungs 2 (two) times daily.  . cetirizine (ZYRTEC ALLERGY) 10 MG tablet Take 10 mg by mouth daily.   . ciprofloxacin (CIPRO) 500 MG tablet Take 500 mg by mouth daily.   . Cranberry Fruit 405  MG CAPS Take 1 capsule by mouth daily.   . cyclobenzaprine (FLEXERIL) 10 MG tablet Take 1 tablet (10 mg total) by mouth at bedtime as needed for muscle spasms.  . cycloSPORINE (RESTASIS) 0.05 % ophthalmic emulsion Place 1 drop into both eyes 2 (two) times daily.   Marland Kitchen levothyroxine (SYNTHROID) 50 MCG tablet TAKE 1 TABLET BY MOUTH DAILY BEFORE BREAKFAST  . metroNIDAZOLE (FLAGYL) 500 MG tablet Take 250 mg by mouth 2 (two) times daily.   . montelukast (SINGULAIR) 10 MG tablet TAKE 1 TABLET BY MOUTH EVERY DAY AS DIRECTED  . Nutritional Supplements (QUINOA KALE & HEMP) LIQD Place 10 drops under the tongue 2 (two) times daily.  . Potassium 99 MG TABS Take 1 tablet by mouth daily.  . promethazine (PHENERGAN) 25 MG tablet TAKE 1 TABLET (25 MG TOTAL) BY MOUTH EVERY 4 (FOUR) HOURS AS NEEDED  . sertraline (ZOLOFT) 25 MG tablet TAKE 1 TABLET BY MOUTH THREE TIMES A DAY  . Simethicone 180 MG CAPS Take 180 mg by mouth 2 (two) times daily.  . traZODone (DESYREL) 100 MG tablet TAKE 2 TABLETS (200 MG TOTAL) BY MOUTH AT BEDTIME.  Marland Kitchen Vitamin D, Ergocalciferol, (DRISDOL) 1.25 MG (50000 UT) CAPS capsule TAKE 1 CAPSULE (50,000 UNITS TOTAL) BY MOUTH EVERY 7 (SEVEN) DAYS.  Marland Kitchen diphenoxylate-atropine (LOMOTIL) 2.5-0.025 MG tablet Take 1 tablet by mouth 4 (four) times daily as needed for diarrhea or loose stools.   . ferrous sulfate 325 (65 FE) MG tablet Take 325 mg by mouth daily with breakfast.  . NYSTATIN powder APPLY TO AFFECTED AREA TWICE A DAY (Patient not taking: Reported on 01/04/2020)  . terconazole (TERAZOL 3) 0.8 % vaginal cream Insert vaginally once weekly (Patient not taking: Reported on 01/04/2020)   No facility-administered medications prior to visit.    Review of Systems  Constitutional: Negative.   HENT: Positive for mouth sores.   Eyes: Positive for redness ("allergies").  Respiratory: Negative.   Cardiovascular: Negative.        "Leg Veins  Gastrointestinal: Negative.        "being treated for Pouchitis"   Endocrine: Positive for cold intolerance.  Genitourinary: Negative.        "Lichen Planus"  Musculoskeletal: Positive for arthralgias and back pain.  Skin: Negative.   Allergic/Immunologic: Positive for environmental allergies and food allergies.  Neurological: Positive for headaches.  Hematological: Negative.   Psychiatric/Behavioral: Negative.     Last CBC Lab Results  Component Value Date   WBC 6.7 08/05/2019   HGB 14.2 08/05/2019   HCT 43.5 08/05/2019   MCV 86 08/05/2019  MCH 28.0 08/05/2019   RDW 13.3 08/05/2019   PLT 264 99/37/1696   Last metabolic panel Lab Results  Component Value Date   GLUCOSE 84 08/05/2019   NA 142 08/05/2019   K 4.0 08/05/2019   CL 103 08/05/2019   CO2 23 08/05/2019   BUN 8 08/05/2019   CREATININE 1.05 (H) 08/05/2019   GFRNONAA 62 08/05/2019   GFRAA 72 08/05/2019   CALCIUM 9.6 08/05/2019   PROT 6.7 08/05/2019   ALBUMIN 4.3 08/05/2019   LABGLOB 2.4 08/05/2019   AGRATIO 1.8 08/05/2019   BILITOT 0.3 08/05/2019   ALKPHOS 94 08/05/2019   AST 27 08/05/2019   ALT 15 08/05/2019   ANIONGAP 5 05/29/2018   Last lipids Lab Results  Component Value Date   CHOL 216 (H) 10/31/2018   HDL 52 10/31/2018   LDLCALC 116 (H) 10/31/2018   TRIG 238 (H) 10/31/2018   CHOLHDL 4.2 10/31/2018   Last hemoglobin A1c Lab Results  Component Value Date   HGBA1C 5.1 10/31/2018   Last thyroid functions Lab Results  Component Value Date   TSH 0.735 08/05/2019   T4TOTAL 8.8 08/05/2019   Last vitamin D Lab Results  Component Value Date   VD25OH 55.1 10/31/2018   Last vitamin B12 and Folate Lab Results  Component Value Date   VITAMINB12 348 09/30/2017      Objective    There were no vitals taken for this visit. BP Readings from Last 3 Encounters:  10/31/18 106/72  05/29/18 117/74  09/30/17 122/70    Physical Exam Vitals reviewed.  Constitutional:      General: She is not in acute distress.    Appearance: Normal appearance. She is  well-developed and normal weight. She is not ill-appearing or diaphoretic.  HENT:     Head: Normocephalic and atraumatic.     Right Ear: Tympanic membrane, ear canal and external ear normal.     Left Ear: Tympanic membrane, ear canal and external ear normal.  Eyes:     General: No scleral icterus.       Right eye: No discharge.        Left eye: No discharge.     Extraocular Movements: Extraocular movements intact.     Conjunctiva/sclera: Conjunctivae normal.     Pupils: Pupils are equal, round, and reactive to light.  Neck:     Thyroid: No thyromegaly.     Vascular: No carotid bruit or JVD.     Trachea: No tracheal deviation.  Cardiovascular:     Rate and Rhythm: Normal rate and regular rhythm.     Pulses: Normal pulses.     Heart sounds: Normal heart sounds. No murmur. No friction rub. No gallop.   Pulmonary:     Effort: Pulmonary effort is normal. No respiratory distress.     Breath sounds: Normal breath sounds. No wheezing or rales.  Chest:     Chest wall: No tenderness.  Abdominal:     General: Abdomen is flat. Bowel sounds are normal. There is no distension.     Palpations: Abdomen is soft. There is no mass.     Tenderness: There is generalized abdominal tenderness. There is no guarding or rebound.     Hernia: No hernia is present.  Musculoskeletal:        General: No tenderness. Normal range of motion.     Cervical back: Normal range of motion and neck supple.     Right lower leg: No edema.     Left lower  leg: No edema.  Lymphadenopathy:     Cervical: No cervical adenopathy.  Skin:    General: Skin is warm and dry.     Capillary Refill: Capillary refill takes less than 2 seconds.     Findings: No rash.  Neurological:     General: No focal deficit present.     Mental Status: She is alert and oriented to person, place, and time. Mental status is at baseline.  Psychiatric:        Mood and Affect: Mood normal.        Behavior: Behavior normal.        Thought Content:  Thought content normal.        Judgment: Judgment normal.      Depression Screen  PHQ 2/9 Scores 01/04/2020 10/31/2018 09/30/2017  PHQ - 2 Score 0 0 0  PHQ- 9 Score 3 0 2    No results found for any visits on 01/04/20.  Assessment & Plan    Routine Health Maintenance and Physical Exam  Exercise Activities and Dietary recommendations Goals   None     Immunization History  Administered Date(s) Administered  . Influenza Split 07/18/2005, 05/22/2013  . Influenza,inj,Quad PF,6+ Mos 06/12/2014, 07/14/2015, 06/05/2016  . Influenza-Unspecified 06/20/2018  . Pneumococcal Polysaccharide-23 07/14/2015  . Td 01/22/2011  . Tdap 01/22/2011    Health Maintenance  Topic Date Due  . MAMMOGRAM  05/16/2019  . COLONOSCOPY  Never done  . INFLUENZA VACCINE  04/03/2020  . TETANUS/TDAP  01/21/2021  . HIV Screening  Completed    Discussed health benefits of physical activity, and encouraged her to engage in regular exercise appropriate for her age and condition.  1. Annual physical exam Normal physical exam today. Will check labs as below and f/u pending lab results. If labs are stable and WNL she will not need to have these rechecked for one year at her next annual physical exam. She is to call the office in the meantime if she has any acute issue, questions or concerns.  2. Encounter for breast cancer screening using non-mammogram modality [facility] oncology for breast exams and mammograms.   3. Exercise-induced asthma with acute exacerbation Stable. Diagnosis pulled for medication refill. Continue current medical treatment plan. - beclomethasone (QVAR) 40 MCG/ACT inhaler; Inhale 2 puffs into the lungs 2 (two) times daily.  Dispense: 8.7 g; Refill: 5  4. Subclinical hypothyroidism Continue levothyroxine 1mg. Will check labs as below and f/u pending results. - CBC w/Diff/Platelet - Comprehensive Metabolic Panel (CMET) - TSH + free T4 - Lipid Profile  5. Primary insomnia Stable.  Diagnosis pulled for medication refill. Continue current medical treatment plan. Will check labs as below and f/u pending results. - CBC w/Diff/Platelet - Comprehensive Metabolic Panel (CMET) - traZODone (DESYREL) 100 MG tablet; Take 2 tablets (200 mg total) by mouth at bedtime.  Dispense: 180 tablet; Refill: 1  6. [place], unspecified type [place] to total colectomy, chronic pouchitis, all from ulcerative colitis. Continue Vit D supplementation. Will check labs as below and f/u pending results. - CBC w/Diff/Platelet - Comprehensive Metabolic Panel (CMET) - Lipid Profile - Vitamin D (25 hydroxy) - Vitamin D, Ergocalciferol, (DRISDOL) 1.25 MG (50000 UNIT) CAPS capsule; Take 1 capsule (50,000 Units total) by mouth every 7 (seven) days.  Dispense: 12 capsule; Refill: 2  7. Clinical depression Stable. Diagnosis pulled for medication refill. Continue current medical treatment plan. - sertraline (ZOLOFT) 25 MG tablet; Take 1 tablet (25 mg total) by mouth 3 (three) times daily.  Dispense: 270 tablet; Refill: 1  8. Other migraine without status migrainosus, not intractable Stable. Diagnosis pulled for medication refill. Continue current medical treatment plan. Will check labs as below and f/u pending results. - CBC w/Diff/Platelet - Comprehensive Metabolic Panel (CMET) - Lipid Profile - promethazine (PHENERGAN) 25 MG tablet; Take 1 tablet (25 mg total) by mouth every 4 (four) hours as needed.  Dispense: 30 tablet; Refill: 5  9. Allergy to environmental factors Stable. Diagnosis pulled for medication refill. Continue current medical treatment plan. - montelukast (SINGULAIR) 10 MG tablet; Take 1 tablet (10 mg total) by mouth daily.  Dispense: 90 tablet; Refill: 1  10. Muscle spasm Stable. Diagnosis pulled for medication refill. Continue current medical treatment plan. - cyclobenzaprine (FLEXERIL) 10 MG tablet; Take 1 tablet (10 mg total) by mouth at bedtime as needed for muscle  spasms.  Dispense: 90 tablet; Refill: 1  11. Pouchitis (H[facility]) On 6 week prednisone taper currently. Followed by [facility].   12. Chronic ulcerative enterocolitis, unspecified complication ([place]) See above medical treatment plan.   No follow-ups on file.     Reynolds Bowl, PA-C, have reviewed all documentation for this visit. The documentation on 01/06/20 for the exam, diagnosis, procedures, and orders are all accurate and complete.   Rubye Beach  [facility] [phone] (phone) [phone] (fax)  [place]

## 2020-01-04 NOTE — Patient Instructions (Signed)

## 2020-01-05 LAB — LIPID PANEL
Chol/HDL Ratio: 3.6 ratio (ref 0.0–4.4)
Cholesterol, Total: 238 mg/dL — ABNORMAL HIGH (ref 100–199)
HDL: 67 mg/dL (ref >39)
LDL Chol Calc (NIH): 140 mg/dL — ABNORMAL HIGH (ref 0–99)
Triglycerides: 174 mg/dL — ABNORMAL HIGH (ref 0–149)
VLDL Cholesterol Cal: 31 mg/dL (ref 5–40)

## 2020-01-05 LAB — COMPREHENSIVE METABOLIC PANEL
ALT: 19 IU/L (ref 0–32)
AST: 25 IU/L (ref 0–40)
Albumin/Globulin Ratio: 1.6 (ref 1.2–2.2)
Albumin: 4.4 g/dL (ref 3.8–4.8)
Alkaline Phosphatase: 98 IU/L (ref 39–117)
BUN/Creatinine Ratio: 16 (ref 9–23)
BUN: 15 mg/dL (ref 6–24)
Bilirubin Total: <0.2 mg/dL (ref 0.0–1.2)
CO2: 25 mmol/L (ref 20–29)
Calcium: 9.7 mg/dL (ref 8.7–10.2)
Chloride: 102 mmol/L (ref 96–106)
Creatinine, Ser: 0.94 mg/dL (ref 0.57–1.00)
GFR calc Af Amer: 82 mL/min/{1.73_m2} (ref >59)
GFR calc non Af Amer: 71 mL/min/{1.73_m2} (ref >59)
Globulin, Total: 2.8 g/dL (ref 1.5–4.5)
Glucose: 124 mg/dL — ABNORMAL HIGH (ref 65–99)
Potassium: 4.4 mmol/L (ref 3.5–5.2)
Sodium: 142 mmol/L (ref 134–144)
Total Protein: 7.2 g/dL (ref 6.0–8.5)

## 2020-01-05 LAB — CBC WITH DIFFERENTIAL/PLATELET
Basophils Absolute: 0.1 10*3/uL (ref 0.0–0.2)
Basos: 1 %
EOS (ABSOLUTE): 0 10*3/uL (ref 0.0–0.4)
Eos: 0 %
Hematocrit: 43.6 % (ref 34.0–46.6)
Hemoglobin: 14.2 g/dL (ref 11.1–15.9)
Immature Grans (Abs): 0.1 10*3/uL (ref 0.0–0.1)
Immature Granulocytes: 1 %
Lymphocytes Absolute: 1.2 10*3/uL (ref 0.7–3.1)
Lymphs: 12 %
MCH: 28.7 pg (ref 26.6–33.0)
MCHC: 32.6 g/dL (ref 31.5–35.7)
MCV: 88 fL (ref 79–97)
Monocytes Absolute: 0.2 10*3/uL (ref 0.1–0.9)
Monocytes: 2 %
Neutrophils Absolute: 8.3 10*3/uL — ABNORMAL HIGH (ref 1.4–7.0)
Neutrophils: 84 %
Platelets: 264 10*3/uL (ref 150–450)
RBC: 4.95 x10E6/uL (ref 3.77–5.28)
RDW: 13.9 % (ref 11.7–15.4)
WBC: 9.8 10*3/uL (ref 3.4–10.8)

## 2020-01-05 LAB — VITAMIN D 25 HYDROXY (VIT D DEFICIENCY, FRACTURES): Vit D, 25-Hydroxy: 53.6 ng/mL (ref 30.0–100.0)

## 2020-01-05 LAB — TSH+FREE T4
Free T4: 1.37 ng/dL (ref 0.82–1.77)
TSH: 0.368 u[IU]/mL — ABNORMAL LOW (ref 0.450–4.500)

## 2020-01-06 ENCOUNTER — Telehealth: Payer: Self-pay

## 2020-01-06 NOTE — Telephone Encounter (Signed)
-----   Message from Mar Daring, [place] sent at 01/06/2020 10:07 AM EDT ----- Blood count is normal ranges, but your white blood cell count is up slightly compared to your baseline. I suspect this to be from the prednisone and inflammation of the pouch you have been dealing with. Kidney and liver function is normal. Sodium, potassium, and calcium is normal. Sugar is up slightly (staff can we add A1c for elevated glucose please). I feel this is due to non-fasting lab and also being on steroid. Will check your A1c (3 month snapshot of blood sugars) to make sure it is normal. Thyroid is slightly overcorrected. Will continue levothyroxine for now since you are on the steroid as this could contribute and since you are not having any symptoms of being hyperthyroid. Cholesterol remains elevated but is essentially stable. Your HDL (good) cholesterol has increased and offers cardioprotection. Vit D is normal.

## 2020-01-06 NOTE — Telephone Encounter (Signed)
Called [facility] and added A1C.  LMTCB 01/06/2020.  PEC please advised pt of lab results below.   Thanks,   -Mickel Baas

## 2020-01-07 NOTE — Telephone Encounter (Signed)
Attempted to call pt.  Left vm to return call to office to review lab results.

## 2020-01-28 LAB — HGB A1C W/O EAG: Hgb A1c MFr Bld: 5.2 % (ref 4.8–5.6)

## 2020-01-28 LAB — SPECIMEN STATUS REPORT

## 2020-01-31 ENCOUNTER — Other Ambulatory Visit: Payer: Self-pay | Admitting: Physician Assistant

## 2020-01-31 DIAGNOSIS — E038 Other specified hypothyroidism: Secondary | ICD-10-CM

## 2020-01-31 NOTE — Telephone Encounter (Signed)
Requested Prescriptions  Pending Prescriptions Disp Refills  . levothyroxine (SYNTHROID) 50 MCG tablet [Pharmacy Med Name: LEVOTHYROXINE 50 MCG TABLET] 90 tablet 0    Sig: TAKE 1 TABLET BY MOUTH EVERY DAY BEFORE BREAKFAST     Endocrinology:  Hypothyroid Agents Failed - 01/31/2020 10:30 AM      Failed - TSH needs to be rechecked within 3 months after an abnormal result. Refill until TSH is due.      Failed - TSH in normal range and within 360 days    TSH  Date Value Ref Range Status  01/04/2020 0.368 (L) 0.450 - 4.500 uIU/mL Final         Failed - Valid encounter within last 12 months    Recent Outpatient Visits          3 weeks ago Annual physical exam   [facility] [place], Clearnce Sorrel, [place]   1 year ago Annual physical exam   [place], Clearnce Sorrel, [place]   2 years ago Annual physical exam   [facility] [place], Clearnce Sorrel, [place]   3 years ago Subclinical hypothyroidism   [facility] [place], Clearnce Sorrel, [place]   3 years ago Vaginal yeast infection   [facility] [place], [place], [place]

## 2020-02-22 IMAGING — CT CT ABD-PELV W/O CM
2 of 4 series · 16 of 46 positions shown, 18 images · non-contrast
Comparison: None.

CLINICAL DATA: 47 y/o F; found on floor with difficulty breathing.
Abdominal and back pain.

EXAM:
CT ABDOMEN AND PELVIS WITHOUT CONTRAST
TECHNIQUE: Multidetector CT imaging of the abdomen and pelvis was performed
following the standard protocol without IV contrast.

[Series 2: routine abd/pel wo · axial · 0.65mm/px · z∈[-650,-275]mm · 13 of 83 slices shown, 15 images]
[im 4/83  soft-tissue]
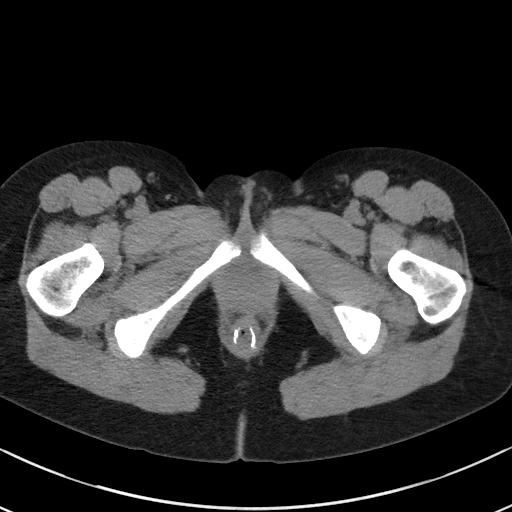
[im 4/83  bone]
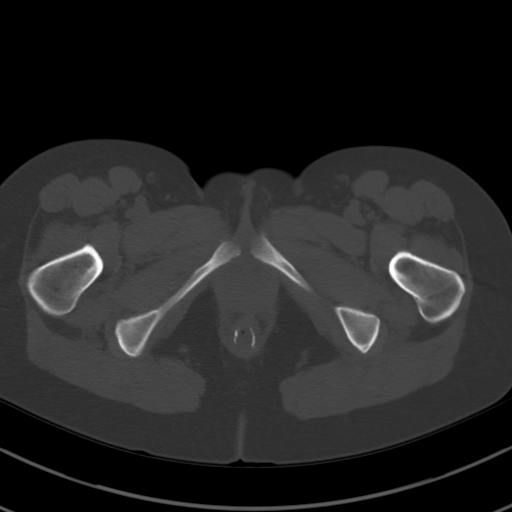
[im 11/83  soft-tissue]
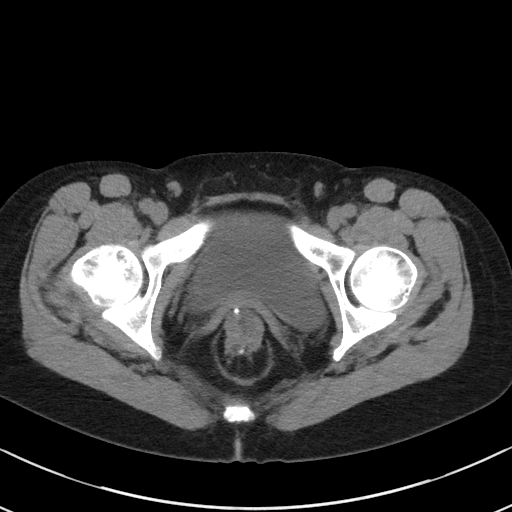
[im 18/83  soft-tissue]
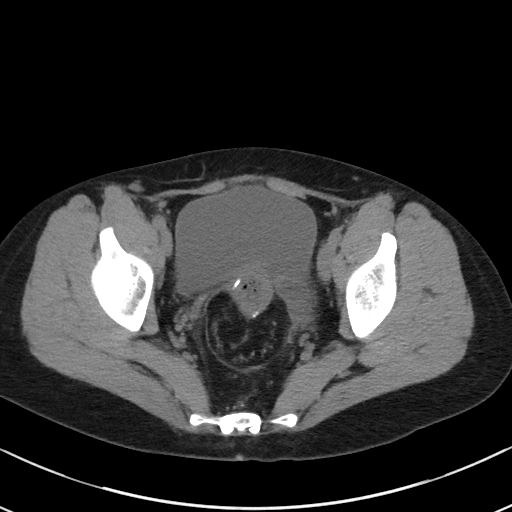
[im 22/83  soft-tissue]
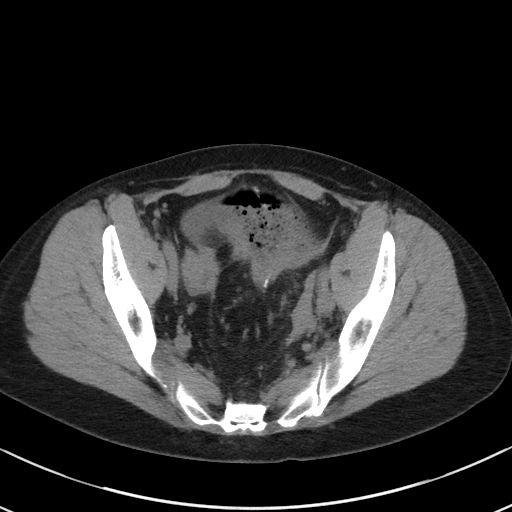
[im 29/83  soft-tissue]
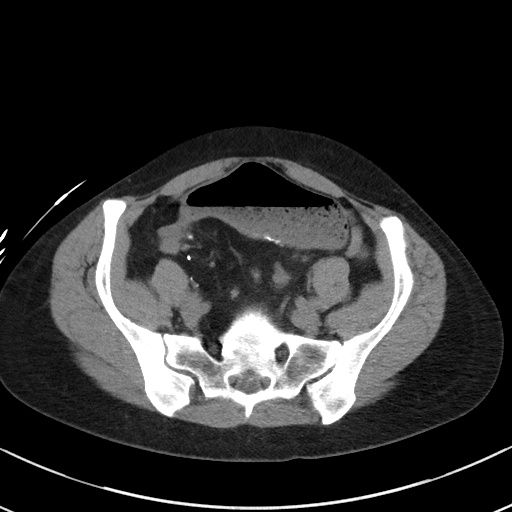
[im 36/83  soft-tissue]
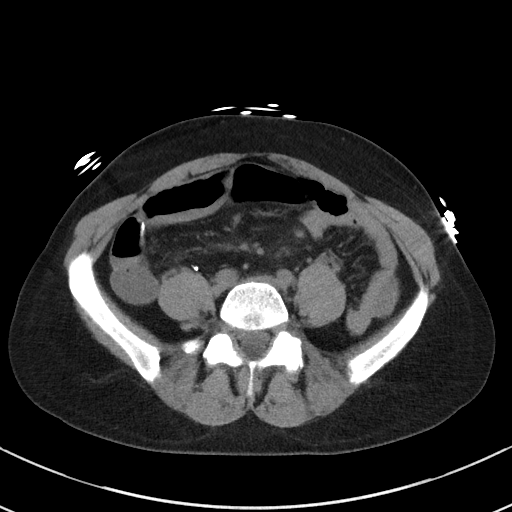
[im 43/83  soft-tissue]
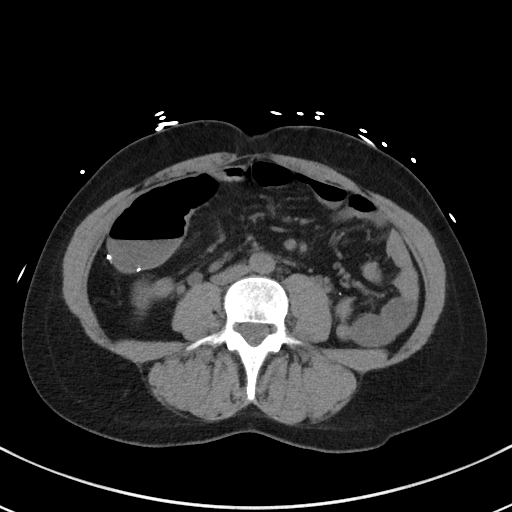
[im 47/83  soft-tissue]
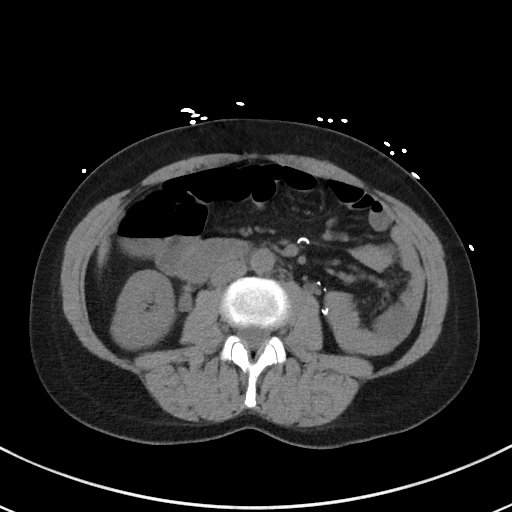
[im 54/83  soft-tissue]
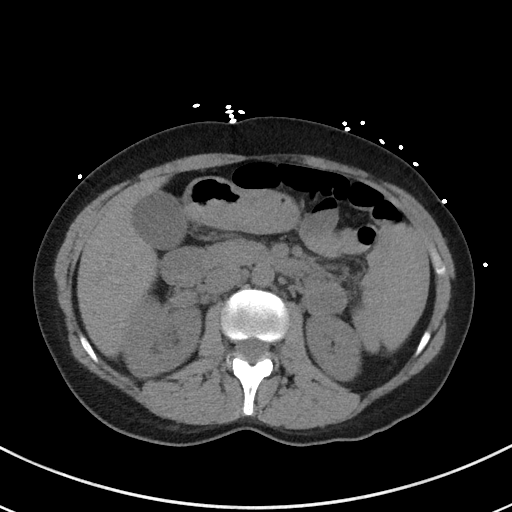
[im 54/83  bone]
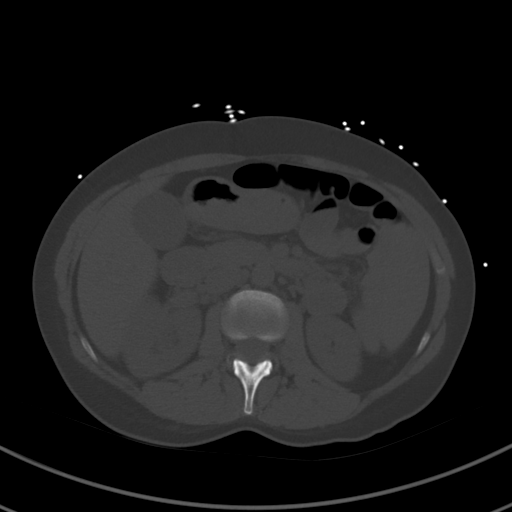
[im 61/83  soft-tissue]
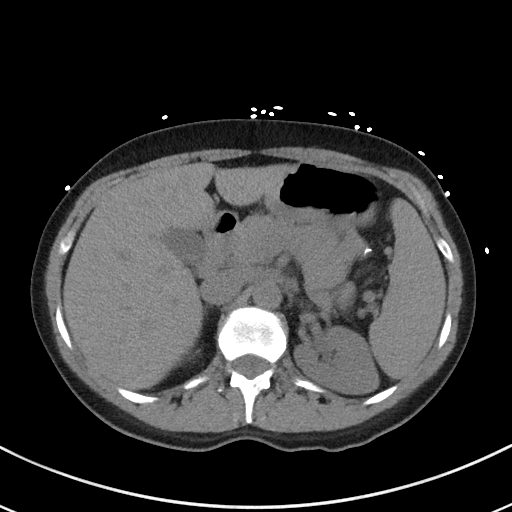
[im 65/83  soft-tissue]
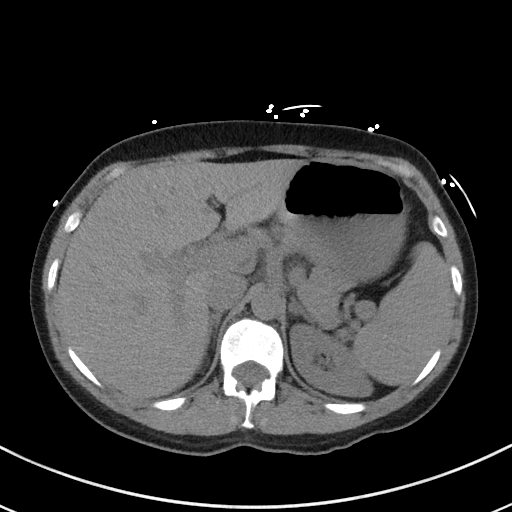
[im 72/83  soft-tissue]
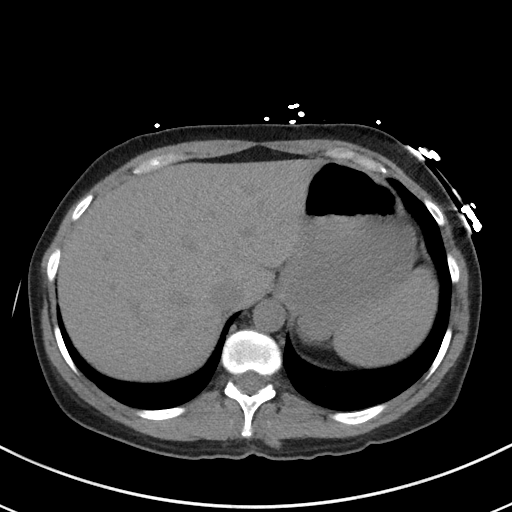
[im 79/83  soft-tissue]
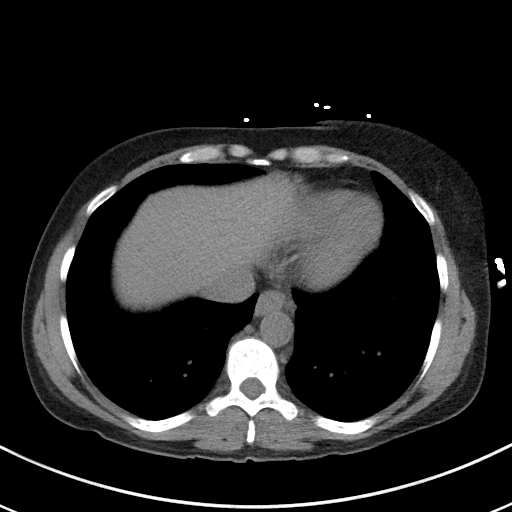

[Series 5: coronal st · coronal · 0.66mm/px · 3 of 78 slices shown]
[im 26/78  soft-tissue]
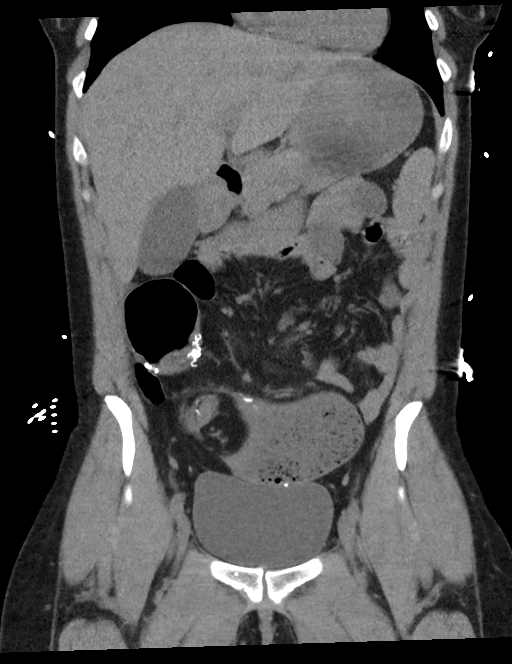
[im 35/78  soft-tissue]
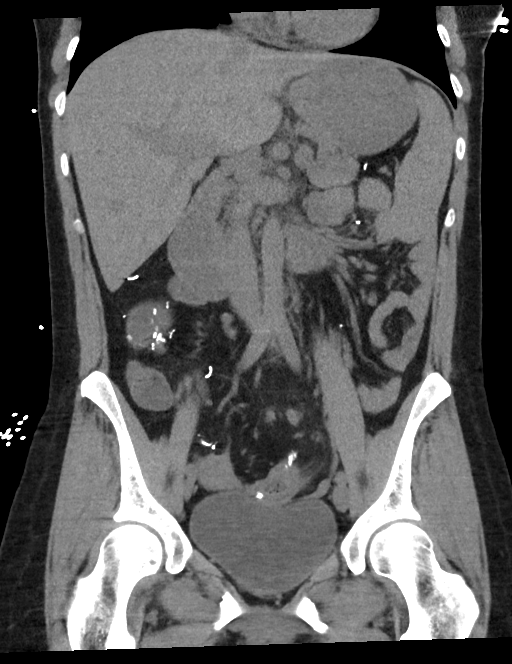
[im 43/78  soft-tissue]
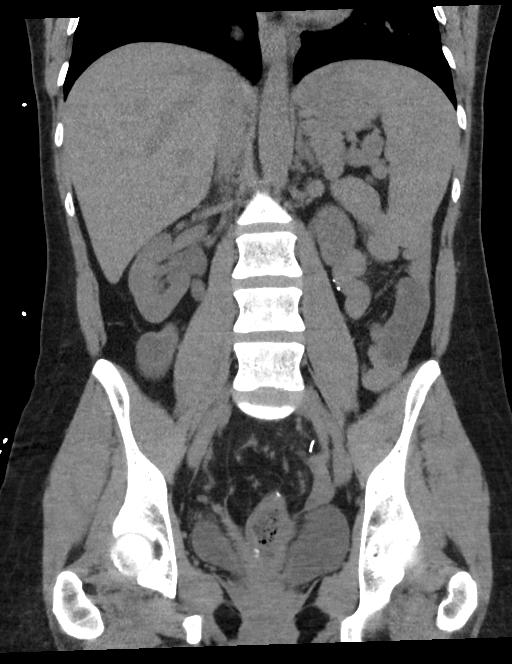

[16 of 46 positions shown; findings below may reference images not displayed]

FINDINGS: Lower chest: No acute abnormality.

Hepatobiliary: No focal liver abnormality is seen. No gallstones,
gallbladder wall thickening, or biliary dilatation.

Pancreas: Unremarkable. No pancreatic ductal dilatation or
surrounding inflammatory changes.

Spleen: The spleen measures 9.8 x 5.0 x 11.8 cm (volume = 300
cm^3).

Adrenals/Urinary Tract: Adrenal glands are unremarkable. Kidneys are
normal, without renal calculi, focal lesion, or hydronephrosis.
Bladder is unremarkable.

Stomach/Bowel: Postsurgical changes related to proctectomy and
J-pouch. No findings of bowel obstruction. Mild wall thickening of
the GB pouch.

Vascular/Lymphatic: No significant vascular findings are present. No
enlarged abdominal or pelvic lymph nodes.

Reproductive: Status post hysterectomy. No adnexal masses.

Other: No abdominal wall hernia or abnormality. No abdominopelvic
ascites.

Musculoskeletal: No fracture is seen.
IMPRESSION: 1. Prostate ectomy with J-pouch. Mild wall thickening of the J-pouch
may reflect infection or inflammation.
2. Borderline splenomegaly.

By: Ashmond Cupido M.D.

## 2020-02-23 DIAGNOSIS — J45909 Unspecified asthma, uncomplicated: Secondary | ICD-10-CM | POA: Diagnosis not present

## 2020-02-23 DIAGNOSIS — K624 Stenosis of anus and rectum: Secondary | ICD-10-CM | POA: Diagnosis not present

## 2020-02-23 DIAGNOSIS — Z9049 Acquired absence of other specified parts of digestive tract: Secondary | ICD-10-CM | POA: Diagnosis not present

## 2020-02-23 DIAGNOSIS — Z980 Intestinal bypass and anastomosis status: Secondary | ICD-10-CM | POA: Diagnosis not present

## 2020-02-23 DIAGNOSIS — K91850 Pouchitis: Secondary | ICD-10-CM | POA: Diagnosis not present

## 2020-02-23 DIAGNOSIS — Z79899 Other long term (current) drug therapy: Secondary | ICD-10-CM | POA: Diagnosis not present

## 2020-02-23 DIAGNOSIS — K5289 Other specified noninfective gastroenteritis and colitis: Secondary | ICD-10-CM | POA: Diagnosis not present

## 2020-02-23 DIAGNOSIS — F329 Major depressive disorder, single episode, unspecified: Secondary | ICD-10-CM | POA: Diagnosis not present

## 2020-03-22 DIAGNOSIS — Z20822 Contact with and (suspected) exposure to covid-19: Secondary | ICD-10-CM | POA: Diagnosis not present

## 2020-04-14 ENCOUNTER — Ambulatory Visit (INDEPENDENT_AMBULATORY_CARE_PROVIDER_SITE_OTHER): Payer: BLUE CROSS/BLUE SHIELD | Admitting: Obstetrics and Gynecology

## 2020-04-14 ENCOUNTER — Encounter: Payer: Self-pay | Admitting: Obstetrics and Gynecology

## 2020-04-14 ENCOUNTER — Other Ambulatory Visit: Payer: Self-pay

## 2020-04-14 VITALS — BP 112/72 | Ht 63.0 in | Wt 131.2 lb

## 2020-04-14 DIAGNOSIS — N94819 Vulvodynia, unspecified: Secondary | ICD-10-CM

## 2020-04-14 DIAGNOSIS — L439 Lichen planus, unspecified: Secondary | ICD-10-CM | POA: Diagnosis not present

## 2020-04-14 DIAGNOSIS — N760 Acute vaginitis: Secondary | ICD-10-CM

## 2020-04-14 DIAGNOSIS — R232 Flushing: Secondary | ICD-10-CM

## 2020-04-14 MED ORDER — LIDOCAINE HCL URETHRAL/MUCOSAL 2 % EX GEL: 1.0000 "application " | CUTANEOUS | 6 refills | Status: AC | PRN

## 2020-04-14 MED ORDER — CLOBETASOL PROPIONATE 0.05 % EX OINT: TOPICAL_OINTMENT | CUTANEOUS | 5 refills | Status: AC

## 2020-04-14 NOTE — Progress Notes (Signed)
Patient ID: Mariah Nguyen, female   DOB: November 22, 1970, 49 y.o.   MRN: [id number]  Reason for Consult: Gynecologic Exam (painful intercoarse/ menopausal symptoms)   Referred by Mar Daring, P*  Subjective:     HPI:  Mariah Nguyen is a 49 y.o. female. She has been having pain with intercourse and vulvar discomfort.   Gynecological History  No LMP recorded. Patient has had a hysterectomy.   History of fibroids, polyps, or ovarian cysts? : no  History of PCOS? no Hstory of Endometriosis? no History of abnormal pap smears? no Have you had any sexually transmitted infections in the past? no   Past Medical History:  Diagnosis Date  . Exercise-induced asthma   . Hx of dysplastic nevus 2003   multiple sites  . Ulcerative colitis ([place]) 1996   Family History  Problem Relation Age of Onset  . Melanoma Father    Past Surgical History:  Procedure Laterality Date  . CESAREAN SECTION  2002  . PARTIAL HYSTERECTOMY  08/2007  . Right eye surgery  07/15/12   [facility]  . Stripping of varicose veins in legs    . TONSILLECTOMY AND ADENOIDECTOMY  01/2005  . Total proctocolectomy with J-pouch  1997   History of recurrent pouchitis    Short Social History:  Social History   Tobacco Use  . Smoking status: Never Smoker  . Smokeless tobacco: Never Used  Substance Use Topics  . Alcohol use: Yes    Alcohol/week: 0.0 standard drinks    Comment: Occasionally    Allergies  Allergen Reactions  . Bentyl  [Dicyclomine]   . Flagyl [Metronidazole]     reportedly has had "nerve damage" in the past, but is prescribed Flagyl by GI doctor at [facility] (2019)  . Meperidine   . Tinidazole Nausea And Vomiting  . Demerol [Meperidine Hcl] Rash  . Other Rash  . Povidone Iodine Rash  . Sulfa Antibiotics Rash    Current Outpatient Medications  Medication Sig Dispense Refill  . acetaminophen (TYLENOL) 325 MG tablet Take 650 mg by mouth every 6 (six) hours as needed for mild pain.     Marland Kitchen ALPRAZolam (XANAX) 0.5 MG tablet Take 1 tablet (0.5 mg total) by mouth 2 (two) times daily as needed for anxiety. 60 tablet 5  . beclomethasone (QVAR) 40 MCG/ACT inhaler Inhale 2 puffs into the lungs 2 (two) times daily. 8.7 g 5  . cetirizine (ZYRTEC ALLERGY) 10 MG tablet Take 10 mg by mouth daily.     . ciprofloxacin (CIPRO) 500 MG tablet Take 500 mg by mouth daily.     . Cranberry Fruit 405 MG CAPS Take 1 capsule by mouth daily.     . cyclobenzaprine (FLEXERIL) 10 MG tablet Take 1 tablet (10 mg total) by mouth at bedtime as needed for muscle spasms. 90 tablet 1  . cycloSPORINE (RESTASIS) 0.05 % ophthalmic emulsion Place 1 drop into both eyes 2 (two) times daily.     . diphenoxylate-atropine (LOMOTIL) 2.5-0.025 MG tablet Take 1 tablet by mouth 4 (four) times daily as needed for diarrhea or loose stools.     . ferrous sulfate 325 (65 FE) MG tablet Take 325 mg by mouth daily with breakfast.    . metroNIDAZOLE (FLAGYL) 500 MG tablet Take 250 mg by mouth 2 (two) times daily.   0  . montelukast (SINGULAIR) 10 MG tablet Take 1 tablet (10 mg total) by mouth daily. 90 tablet 1  . Nutritional Supplements (QUINOA KALE &  HEMP) LIQD Place 10 drops under the tongue 2 (two) times daily.    . NYSTATIN powder APPLY TO AFFECTED AREA TWICE A DAY 60 g 0  . Potassium 99 MG TABS Take 1 tablet by mouth daily.    . promethazine (PHENERGAN) 25 MG tablet Take 1 tablet (25 mg total) by mouth every 4 (four) hours as needed. 30 tablet 5  . Simethicone 180 MG CAPS Take 180 mg by mouth 2 (two) times daily.    . traZODone (DESYREL) 100 MG tablet Take 2 tablets (200 mg total) by mouth at bedtime. 180 tablet 1  . clobetasol ointment (TEMOVATE) 0.05 % Apply to affected area every night for 4 weeks, then every other day for 4 weeks and then twice a week for 4 weeks or until resolution. 30 g 5  . levothyroxine (SYNTHROID) 50 MCG tablet TAKE 1 TABLET BY MOUTH EVERY DAY BEFORE BREAKFAST 90 tablet 0  . lidocaine (XYLOCAINE) 2 %  jelly Apply 1 application topically as needed. 30 mL 6  . sertraline (ZOLOFT) 25 MG tablet TAKE 1 TABLET BY MOUTH THREE TIMES A DAY 270 tablet 0  . VENTOLIN HFA 108 (90 Base) MCG/ACT inhaler INHALE 2 PUFFS INTO THE LUNGS EVERY 6 HOURS AS NEEDED FOR WHEEZING OR SHORTNESS OF BREATH 18 each 2  . Vitamin D, Ergocalciferol, (DRISDOL) 1.25 MG (50000 UNIT) CAPS capsule TAKE 1 CAPSULE (50,000 UNITS TOTAL) BY MOUTH EVERY 7 (SEVEN) DAYS. 12 capsule 1   No current facility-administered medications for this visit.    REVIEW OF SYSTEMS      Objective:  Objective   Vitals:   04/14/20 1435  BP: 112/72  Weight: 131 lb 3.2 oz (59.5 kg)  Height: 5' 3"  (1.6 m)   Body mass index is 23.24 kg/m.  Physical Exam Vitals and nursing note reviewed. Exam conducted with a chaperone present.  Constitutional:      Appearance: Normal appearance. She is well-developed.  HENT:     Head: Normocephalic and atraumatic.  Eyes:     Extraocular Movements: Extraocular movements intact.     Pupils: Pupils are equal, round, and reactive to light.  Cardiovascular:     Rate and Rhythm: Normal rate and regular rhythm.  Pulmonary:     Effort: Pulmonary effort is normal. No respiratory distress.     Breath sounds: Normal breath sounds.  Abdominal:     General: Abdomen is flat.     Palpations: Abdomen is soft.  Genitourinary:    Comments: External: Normal appearing vulva. No lesions noted.  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  Skin:    General: Skin is warm and dry.  Neurological:     Mental Status: She is alert and oriented to person, place, and time.  Psychiatric:        Behavior: Behavior normal.        Thought Content: Thought content normal.        Judgment: Judgment normal.     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

## 2020-04-16 LAB — NUSWAB BV AND CANDIDA, NAA
Candida albicans, NAA: NEGATIVE
Candida glabrata, NAA: NEGATIVE

## 2020-04-18 DIAGNOSIS — J340 Abscess, furuncle and carbuncle of nose: Secondary | ICD-10-CM | POA: Diagnosis not present

## 2020-04-18 DIAGNOSIS — J309 Allergic rhinitis, unspecified: Secondary | ICD-10-CM | POA: Diagnosis not present

## 2020-04-19 DIAGNOSIS — J301 Allergic rhinitis due to pollen: Secondary | ICD-10-CM | POA: Diagnosis not present

## 2020-04-25 DIAGNOSIS — J305 Allergic rhinitis due to food: Secondary | ICD-10-CM | POA: Diagnosis not present

## 2020-04-26 ENCOUNTER — Other Ambulatory Visit: Payer: Self-pay | Admitting: Physician Assistant

## 2020-04-26 DIAGNOSIS — E038 Other specified hypothyroidism: Secondary | ICD-10-CM

## 2020-04-26 NOTE — Telephone Encounter (Signed)
Requested medications are due for refill today?  Yes  Requested medications are on active medication list? Yes  Last Refill:   01/31/2020 # 90 with no refills  Future visit scheduled?  Yes  Notes to Clinic:  Medication failed RX refill protocol due to abnormal TSH on 01/04/2020.  No repeat TSH has been performed.  Please review.

## 2020-05-04 DIAGNOSIS — Z20822 Contact with and (suspected) exposure to covid-19: Secondary | ICD-10-CM | POA: Diagnosis not present

## 2020-05-05 ENCOUNTER — Ambulatory Visit: Payer: BC Managed Care – PPO | Admitting: Dermatology

## 2020-05-05 ENCOUNTER — Other Ambulatory Visit: Payer: Self-pay

## 2020-05-05 DIAGNOSIS — L720 Epidermal cyst: Secondary | ICD-10-CM | POA: Diagnosis not present

## 2020-05-05 DIAGNOSIS — D485 Neoplasm of uncertain behavior of skin: Secondary | ICD-10-CM

## 2020-05-05 DIAGNOSIS — D492 Neoplasm of unspecified behavior of bone, soft tissue, and skin: Secondary | ICD-10-CM

## 2020-05-05 DIAGNOSIS — L989 Disorder of the skin and subcutaneous tissue, unspecified: Secondary | ICD-10-CM | POA: Diagnosis not present

## 2020-05-05 NOTE — Patient Instructions (Signed)

## 2020-05-05 NOTE — Progress Notes (Signed)
   Follow-Up Visit   Subjective  Mariah Nguyen is a 49 y.o. female who presents for the following: Skin Problem (Check a rough bump on the back, will not go away ).  The following portions of the chart were reviewed this encounter and updated as appropriate:  Tobacco  Allergies  Meds  Problems  Med Hx  Surg Hx  Fam Hx     Review of Systems:  No other skin or systemic complaints except as noted in HPI or Assessment and Plan.  Objective  Well appearing patient in no apparent distress; mood and affect are within normal limits.  A focused examination was performed including face, neck, chest and back and back, buttock . Relevant physical exam findings are noted in the Assessment and Plan.2  Objective  Mid Back: Smooth white papule(s).   Objective  Right sup buttock: 0.5 cm irregular macule    Assessment & Plan  Milia Mid Back  Milia   Acne/Milia surgery - Mid Back Procedure risks and benefits were discussed with the patient and verbal consent was obtained. Following prep of the skin on the back with an alcohol swab, extraction of milia was performed with a comedone extractor following superficial incision made over their surfaces with a #11 surgical blade. Capillary hemostasis was achieved with 20% aluminum chloride solution. Vaseline ointment was applied to each site. The patient tolerated the procedure well.  Neoplasm of skin Right sup buttock  Skin / nail biopsy Type of biopsy: tangential   Informed consent: discussed and consent obtained   Patient was prepped and draped in usual sterile fashion: area prepped with alochol. Anesthesia: the lesion was anesthetized in a standard fashion   Anesthetic:  1% lidocaine w/ epinephrine 1-100,000 buffered w/ 8.4% NaHCO3 Instrument used: flexible razor blade   Hemostasis achieved with: pressure, aluminum chloride and electrodesiccation   Outcome: patient tolerated procedure well   Post-procedure details: wound care  instructions given   Post-procedure details comment:  Ointment and small bandage  Specimen 1 - Surgical pathology Differential Diagnosis: R/O Dysplastic nevus Check Margins: No 0.5 cm irregular macule  Return if symptoms worsen or fail to improve.  IMarye Round, CMA, am acting as scribe for Sarina Ser, MD .  Documentation: I have reviewed the above documentation for accuracy and completeness, and I agree with the above.  Sarina Ser, MD

## 2020-05-10 ENCOUNTER — Telehealth: Payer: Self-pay

## 2020-05-10 NOTE — Telephone Encounter (Signed)
Pt calling; needs blood work orders put in.  [phone]  Left detailed msg the orders are in; to call and schedule appt to have them drawn.

## 2020-05-11 ENCOUNTER — Encounter: Payer: Self-pay | Admitting: Dermatology

## 2020-05-11 DIAGNOSIS — K529 Noninfective gastroenteritis and colitis, unspecified: Secondary | ICD-10-CM | POA: Diagnosis not present

## 2020-05-11 DIAGNOSIS — K91850 Pouchitis: Secondary | ICD-10-CM | POA: Diagnosis not present

## 2020-05-11 DIAGNOSIS — Z8719 Personal history of other diseases of the digestive system: Secondary | ICD-10-CM | POA: Diagnosis not present

## 2020-05-11 DIAGNOSIS — Z23 Encounter for immunization: Secondary | ICD-10-CM | POA: Diagnosis not present

## 2020-05-12 ENCOUNTER — Ambulatory Visit: Payer: Self-pay | Admitting: Obstetrics and Gynecology

## 2020-05-12 DIAGNOSIS — Z20822 Contact with and (suspected) exposure to covid-19: Secondary | ICD-10-CM | POA: Diagnosis not present

## 2020-05-16 ENCOUNTER — Telehealth: Payer: Self-pay

## 2020-05-16 NOTE — Telephone Encounter (Signed)
-----   Message from Ralene Bathe, MD sent at 05/12/2020  6:14 PM EDT ----- SUPERFICIAL SCAR WITH FEATURES OF NEVUS, RECURRENT. - SEPARATE FRAGMENT VERRUCOUS EPIDERMAL HYPERPLASIA WITH LICHENOID INFILTRATE,  Benign mole, possibly traumatized in past with associated scar and thickened skin with inflammation. No further treatment needed. Re-check at next visit

## 2020-05-16 NOTE — Telephone Encounter (Signed)
Left message on voicemail to return my call.  

## 2020-05-17 ENCOUNTER — Telehealth: Payer: Self-pay

## 2020-05-17 NOTE — Telephone Encounter (Signed)
-----   Message from Ralene Bathe, MD sent at 05/12/2020  6:14 PM EDT ----- SUPERFICIAL SCAR WITH FEATURES OF NEVUS, RECURRENT. - SEPARATE FRAGMENT VERRUCOUS EPIDERMAL HYPERPLASIA WITH LICHENOID INFILTRATE,  Benign mole, possibly traumatized in past with associated scar and thickened skin with inflammation. No further treatment needed. Re-check at next visit

## 2020-05-17 NOTE — Telephone Encounter (Signed)
Pt returned call. She reviewed her results on MyChart. She had no questions.

## 2020-05-17 NOTE — Telephone Encounter (Signed)
Left message on voicemail to return my call.  

## 2020-05-18 DIAGNOSIS — Z20822 Contact with and (suspected) exposure to covid-19: Secondary | ICD-10-CM | POA: Diagnosis not present

## 2020-07-21 ENCOUNTER — Other Ambulatory Visit: Payer: Self-pay | Admitting: Physician Assistant

## 2020-07-21 DIAGNOSIS — E038 Other specified hypothyroidism: Secondary | ICD-10-CM

## 2020-08-12 DIAGNOSIS — I8312 Varicose veins of left lower extremity with inflammation: Secondary | ICD-10-CM | POA: Diagnosis not present

## 2020-08-12 DIAGNOSIS — I8311 Varicose veins of right lower extremity with inflammation: Secondary | ICD-10-CM | POA: Diagnosis not present

## 2020-08-15 DIAGNOSIS — I8312 Varicose veins of left lower extremity with inflammation: Secondary | ICD-10-CM | POA: Diagnosis not present

## 2020-08-15 DIAGNOSIS — I8311 Varicose veins of right lower extremity with inflammation: Secondary | ICD-10-CM | POA: Diagnosis not present

## 2020-08-16 DIAGNOSIS — Z6823 Body mass index (BMI) 23.0-23.9, adult: Secondary | ICD-10-CM | POA: Diagnosis not present

## 2020-08-16 DIAGNOSIS — R928 Other abnormal and inconclusive findings on diagnostic imaging of breast: Secondary | ICD-10-CM | POA: Diagnosis not present

## 2020-08-16 DIAGNOSIS — Z803 Family history of malignant neoplasm of breast: Secondary | ICD-10-CM | POA: Diagnosis not present

## 2020-08-23 ENCOUNTER — Other Ambulatory Visit: Payer: Self-pay | Admitting: Physician Assistant

## 2020-08-23 DIAGNOSIS — F3342 Major depressive disorder, recurrent, in full remission: Secondary | ICD-10-CM

## 2020-08-23 NOTE — Telephone Encounter (Signed)
Courtesy refill. Pt has appt 01/05/21

## 2020-08-24 DIAGNOSIS — I8311 Varicose veins of right lower extremity with inflammation: Secondary | ICD-10-CM | POA: Diagnosis not present

## 2020-08-24 DIAGNOSIS — I8312 Varicose veins of left lower extremity with inflammation: Secondary | ICD-10-CM | POA: Diagnosis not present

## 2020-08-25 ENCOUNTER — Other Ambulatory Visit: Payer: Self-pay | Admitting: Physician Assistant

## 2020-08-25 DIAGNOSIS — J45990 Exercise induced bronchospasm: Secondary | ICD-10-CM

## 2020-08-25 NOTE — Telephone Encounter (Signed)
Requested medication (s) are due for refill today - expired Rx  Requested medication (s) are on the active medication list -yes  Future visit scheduled -yes  Last refill: 06/03/18  Notes to clinic: Request RF of expired Rx- sent for PCP review   Requested Prescriptions  Pending Prescriptions Disp Refills   VENTOLIN HFA 108 (90 Base) MCG/ACT inhaler [Pharmacy Med Name: VENTOLIN HFA 90 MCG INHALER] 18 each 2    Sig: INHALE 2 PUFFS INTO THE LUNGS EVERY 6 HOURS AS NEEDED FOR WHEEZING OR SHORTNESS OF BREATH      Pulmonology:  Beta Agonists Failed - 08/25/2020 11:42 AM      Failed - One inhaler should last at least one month. If the patient is requesting refills earlier, contact the patient to check for uncontrolled symptoms.      Passed - Valid encounter within last 12 months    Recent Outpatient Visits           7 months ago Annual physical exam   [facility] [place], Clearnce Sorrel, [place]   1 year ago Annual physical exam   [facility] [place], Clearnce Sorrel, [place]   2 years ago Annual physical exam   [facility] [place] Malling M, [place]   4 years ago Subclinical hypothyroidism   [facility] [place], Clearnce Sorrel, [place]   4 years ago Vaginal yeast infection   [facility] [place], Clearnce Sorrel, [place]       Future Appointments             In 4 months Burnette, Clearnce Sorrel, PA-C [employer], [place]                 Requested Prescriptions  Pending Prescriptions Disp Refills   VENTOLIN HFA 108 (90 Base) MCG/ACT inhaler [Pharmacy Med Name: VENTOLIN HFA 90 MCG INHALER] 18 each 2    Sig: INHALE 2 PUFFS INTO THE LUNGS EVERY 6 HOURS AS NEEDED FOR WHEEZING OR SHORTNESS OF BREATH      Pulmonology:  Beta Agonists Failed - 08/25/2020 11:42 AM      Failed - One inhaler should last at least one month. If the patient is requesting refills earlier, contact the patient to check for uncontrolled symptoms.       Passed - Valid encounter within last 12 months    Recent Outpatient Visits           7 months ago Annual physical exam   [facility] [place], Clearnce Sorrel, [place]   1 year ago Annual physical exam   [facility] [place], Clearnce Sorrel, [place]   2 years ago Annual physical exam   [facility] [place] Malling M, [place]   4 years ago Subclinical hypothyroidism   [place], Clearnce Sorrel, [place]   4 years ago Vaginal yeast infection   [facility] [place], Clearnce Sorrel, [place]       Future Appointments             In 4 months Burnette, Clearnce Sorrel, PA-C [employer], [place]

## 2020-09-07 DIAGNOSIS — Z9049 Acquired absence of other specified parts of digestive tract: Secondary | ICD-10-CM | POA: Diagnosis not present

## 2020-09-07 DIAGNOSIS — Z8719 Personal history of other diseases of the digestive system: Secondary | ICD-10-CM | POA: Diagnosis not present

## 2020-09-07 DIAGNOSIS — K91850 Pouchitis: Secondary | ICD-10-CM | POA: Diagnosis not present

## 2020-10-10 DIAGNOSIS — K91850 Pouchitis: Secondary | ICD-10-CM | POA: Diagnosis not present

## 2020-10-24 DIAGNOSIS — K91850 Pouchitis: Secondary | ICD-10-CM | POA: Diagnosis not present

## 2020-10-28 DIAGNOSIS — Z79899 Other long term (current) drug therapy: Secondary | ICD-10-CM | POA: Diagnosis not present

## 2020-10-28 DIAGNOSIS — Z9049 Acquired absence of other specified parts of digestive tract: Secondary | ICD-10-CM | POA: Diagnosis not present

## 2020-10-28 DIAGNOSIS — K91850 Pouchitis: Secondary | ICD-10-CM | POA: Diagnosis not present

## 2020-10-28 DIAGNOSIS — Z8719 Personal history of other diseases of the digestive system: Secondary | ICD-10-CM | POA: Diagnosis not present

## 2020-11-08 ENCOUNTER — Other Ambulatory Visit: Payer: Self-pay | Admitting: Physician Assistant

## 2020-11-08 DIAGNOSIS — E46 Unspecified protein-calorie malnutrition: Secondary | ICD-10-CM

## 2020-11-08 NOTE — Telephone Encounter (Signed)
Requested medication (s) are due for refill today:   Provider to review  Requested medication (s) are on the active medication list:   Yes  Future visit scheduled:  Yes   Last ordered: 01/04/2020 #12, 2 refills  Non delegated refill   Requested Prescriptions  Pending Prescriptions Disp Refills   Vitamin D, Ergocalciferol, (DRISDOL) 1.25 MG (50000 UNIT) CAPS capsule [Pharmacy Med Name: VITAMIN D2 1.25MG(50,000 UNIT)] 12 capsule 2    Sig: Take 1 capsule (50,000 Units total) by mouth every 7 (seven) days.      Endocrinology:  Vitamins - Vitamin D Supplementation Failed - 11/08/2020  8:41 AM      Failed - 50,000 IU strengths are not delegated      Failed - Phosphate in normal range and within 360 days    No results found for: PHOS        Passed - Ca in normal range and within 360 days    Calcium  Date Value Ref Range Status  01/04/2020 9.7 8.7 - 10.2 mg/dL Final   Calcium, Total  Date Value Ref Range Status  10/18/2012 9.0 8.5 - 10.1 mg/dL Final          Passed - Vitamin D in normal range and within 360 days    Vitamin D2 1, 25 (OH)2  Date Value Ref Range Status  06/05/2016 35 pg/mL Final   Vitamin D3 1, 25 (OH)2  Date Value Ref Range Status  06/05/2016 11 pg/mL Final   Vitamin D 1, 25 (OH)2 Total  Date Value Ref Range Status  06/05/2016 46 pg/mL Final    Comment:    Reference Range: Adults: 21 - 65    Vit D, 25-Hydroxy  Date Value Ref Range Status  01/04/2020 53.6 30.0 - 100.0 ng/mL Final    Comment:    Vitamin D deficiency has been defined by the Institute of Medicine and an Endocrine Society practice guideline as a level of serum 25-OH vitamin D less than 20 ng/mL (1,2). The Endocrine Society went on to further define vitamin D insufficiency as a level between 21 and 29 ng/mL (2). 1. IOM (Institute of Medicine). 2010. Dietary reference    intakes for calcium and D. Basco: The    [employer]. 2. Holick MF, [place], Bischoff-Ferrari HA,  et al.    Evaluation, treatment, and prevention of vitamin D    deficiency: an Endocrine Society clinical practice    guideline. JCEM. 2011 Jul; 96(7):1911-30.           Passed - Valid encounter within last 12 months    Recent Outpatient Visits           10 months ago Annual physical exam   [facility] [place], Clearnce Sorrel, [place]   2 years ago Annual physical exam   [facility] [place], Clearnce Sorrel, [place]   3 years ago Annual physical exam   [facility] Fenton Malling M, [place]   4 years ago Subclinical hypothyroidism   [facility] [place], Clearnce Sorrel, [place]   4 years ago Vaginal yeast infection   [facility] [place], Clearnce Sorrel, [place]       Future Appointments             In 1 month Burnette, Clearnce Sorrel, PA-C [employer], [place]

## 2020-11-21 DIAGNOSIS — K91850 Pouchitis: Secondary | ICD-10-CM | POA: Diagnosis not present

## 2020-11-29 ENCOUNTER — Other Ambulatory Visit: Payer: Self-pay | Admitting: Physician Assistant

## 2020-11-29 DIAGNOSIS — E038 Other specified hypothyroidism: Secondary | ICD-10-CM

## 2020-11-29 NOTE — Telephone Encounter (Signed)
Requested Prescriptions  Pending Prescriptions Disp Refills  . levothyroxine (SYNTHROID) 50 MCG tablet [Pharmacy Med Name: LEVOTHYROXINE 50 MCG TABLET] 90 tablet 0    Sig: TAKE 1 TABLET BY MOUTH EVERY DAY BEFORE BREAKFAST     Endocrinology:  Hypothyroid Agents Failed - 11/29/2020 10:10 PM      Failed - TSH needs to be rechecked within 3 months after an abnormal result. Refill until TSH is due.      Failed - TSH in normal range and within 360 days    TSH  Date Value Ref Range Status  01/04/2020 0.368 (L) 0.450 - 4.500 uIU/mL Final         Passed - Valid encounter within last 12 months    Recent Outpatient Visits          11 months ago Annual physical exam   [facility] [place], Clearnce Sorrel, [place]   2 years ago Annual physical exam   [facility] [place], Clearnce Sorrel, [place]   3 years ago Annual physical exam   [facility] Fenton Malling M, [place]   4 years ago Subclinical hypothyroidism   [facility] [place], Clearnce Sorrel, [place]   4 years ago Vaginal yeast infection   [facility] [place], Clearnce Sorrel, [place]      Future Appointments            In 1 month Burnette, Clearnce Sorrel, PA-C [employer], [place]

## 2020-12-01 ENCOUNTER — Other Ambulatory Visit: Payer: Self-pay | Admitting: Physician Assistant

## 2020-12-01 DIAGNOSIS — J45990 Exercise induced bronchospasm: Secondary | ICD-10-CM

## 2020-12-01 NOTE — Telephone Encounter (Signed)
Requested Prescriptions  Pending Prescriptions Disp Refills  . VENTOLIN HFA 108 (90 Base) MCG/ACT inhaler [Pharmacy Med Name: VENTOLIN HFA 90 MCG INHALER] 18 each 2    Sig: INHALE 2 PUFFS INTO THE LUNGS EVERY 6 HOURS AS NEEDED FOR WHEEZING OR SHORTNESS OF BREATH     Pulmonology:  Beta Agonists Failed - 12/01/2020  2:02 AM      Failed - One inhaler should last at least one month. If the patient is requesting refills earlier, contact the patient to check for uncontrolled symptoms.      Passed - Valid encounter within last 12 months    Recent Outpatient Visits          11 months ago Annual physical exam   [facility] [place], Clearnce Sorrel, [place]   2 years ago Annual physical exam   [facility] [place], Clearnce Sorrel, [place]   3 years ago Annual physical exam   [facility] [place] M[place], [place]   4 years ago Subclinical hypothyroidism   [facility] [place], Clearnce Sorrel, [place]   4 years ago Vaginal yeast infection   [facility] [place], Clearnce Sorrel, [place]      Future Appointments            In 1 month Burnette, Clearnce Sorrel, PA-C [employer], [place]

## 2021-01-05 ENCOUNTER — Other Ambulatory Visit: Payer: Self-pay

## 2021-01-05 ENCOUNTER — Ambulatory Visit: Payer: BC Managed Care – PPO | Admitting: Family Medicine

## 2021-01-05 ENCOUNTER — Encounter: Payer: Self-pay | Admitting: Family Medicine

## 2021-01-05 ENCOUNTER — Encounter: Payer: Self-pay | Admitting: Physician Assistant

## 2021-01-05 VITALS — BP 106/66 | HR 77 | Temp 97.8°F | Resp 16 | Wt 137.0 lb

## 2021-01-05 DIAGNOSIS — E038 Other specified hypothyroidism: Secondary | ICD-10-CM

## 2021-01-05 DIAGNOSIS — J45990 Exercise induced bronchospasm: Secondary | ICD-10-CM | POA: Diagnosis not present

## 2021-01-05 DIAGNOSIS — F3342 Major depressive disorder, recurrent, in full remission: Secondary | ICD-10-CM | POA: Diagnosis not present

## 2021-01-05 DIAGNOSIS — Z9049 Acquired absence of other specified parts of digestive tract: Secondary | ICD-10-CM

## 2021-01-05 DIAGNOSIS — Z9109 Other allergy status, other than to drugs and biological substances: Secondary | ICD-10-CM

## 2021-01-05 DIAGNOSIS — F419 Anxiety disorder, unspecified: Secondary | ICD-10-CM | POA: Diagnosis not present

## 2021-01-05 DIAGNOSIS — K51019 Ulcerative (chronic) pancolitis with unspecified complications: Secondary | ICD-10-CM

## 2021-01-05 DIAGNOSIS — K91850 Pouchitis: Secondary | ICD-10-CM

## 2021-01-05 DIAGNOSIS — F5101 Primary insomnia: Secondary | ICD-10-CM

## 2021-01-05 MED ORDER — LEVOTHYROXINE SODIUM 50 MCG PO TABS: 50.0000 ug | ORAL_TABLET | Freq: Every day | ORAL | 1 refills | Status: DC

## 2021-01-05 MED ORDER — MONTELUKAST SODIUM 10 MG PO TABS: 10.0000 mg | ORAL_TABLET | Freq: Every day | ORAL | 1 refills | Status: AC

## 2021-01-05 MED ORDER — BECLOMETHASONE DIPROPIONATE 40 MCG/ACT IN AERS: 2.0000 | INHALATION_SPRAY | Freq: Two times a day (BID) | RESPIRATORY_TRACT | 5 refills | Status: AC

## 2021-01-05 MED ORDER — TRAZODONE HCL 100 MG PO TABS: 200.0000 mg | ORAL_TABLET | Freq: Every day | ORAL | 1 refills | Status: AC

## 2021-01-05 MED ORDER — ALBUTEROL SULFATE HFA 108 (90 BASE) MCG/ACT IN AERS: 2.0000 | INHALATION_SPRAY | Freq: Four times a day (QID) | RESPIRATORY_TRACT | 2 refills | Status: AC | PRN

## 2021-01-05 MED ORDER — SERTRALINE HCL 25 MG PO TABS: 25.0000 mg | ORAL_TABLET | Freq: Three times a day (TID) | ORAL | 0 refills | Status: AC

## 2021-01-05 NOTE — Progress Notes (Signed)
I,April Miller,acting as a scribe for Wilhemena Durie, MD.,have documented all relevant documentation on the behalf of Wilhemena Durie, MD,as directed by  Wilhemena Durie, MD while in the presence of Wilhemena Durie, MD.   Established patient visit   Patient: Mariah Nguyen   DOB: 03/13/1971   50 y.o. Female  MRN: [id number] Visit Date: 01/05/2021  Today's healthcare provider: Wilhemena Durie, MD   Chief Complaint  Patient presents with  . Follow-up   Subjective    HPI  Patient is here for medication refills.  She has not been seen here in over a year and did not answer phone calls canceled that she needed to be rescheduled.  She showed up without an appointment today but I saw her anyway.  States she is in [place] she has chronic ulcerative colitis with pouchitis.  Followed by GI and is on Remicade. She has long history of anxiety and depression on sertraline.  She has had surgeries on varicose veins and claims to have allergies and asthma but is followed by allergist only with no inhalers other than Qvar and she is on Zyrtec and montelukast. She says she cannot breathe and when she gets too hot she has to stop and states she needs a handicap parking sticker.  She has a tennis racquet in her back to go play tennis later today.  She declines cardiology referral.  Has not had routine labs in some time.      Medications: Outpatient Medications Prior to Visit  Medication Sig  . acetaminophen (TYLENOL) 325 MG tablet Take 650 mg by mouth every 6 (six) hours as needed for mild pain.  Marland Kitchen ALPRAZolam (XANAX) 0.5 MG tablet Take 1 tablet (0.5 mg total) by mouth 2 (two) times daily as needed for anxiety.  . beclomethasone (QVAR) 40 MCG/ACT inhaler Inhale 2 puffs into the lungs 2 (two) times daily.  . cetirizine (ZYRTEC) 10 MG tablet Take 10 mg by mouth daily.   . cetirizine (ZYRTEC) 10 MG tablet Take by mouth.  . clobetasol ointment (TEMOVATE) 0.05 % Apply to affected  area every night for 4 weeks, then every other day for 4 weeks and then twice a week for 4 weeks or until resolution.  . Cranberry Fruit 405 MG CAPS Take 1 capsule by mouth daily.   . cyclobenzaprine (FLEXERIL) 10 MG tablet Take 1 tablet (10 mg total) by mouth at bedtime as needed for muscle spasms.  . cycloSPORINE (RESTASIS) 0.05 % ophthalmic emulsion Place 1 drop into both eyes 2 (two) times daily.   . diphenoxylate-atropine (LOMOTIL) 2.5-0.025 MG tablet Take 1 tablet by mouth 4 (four) times daily as needed for diarrhea or loose stools.   . ferrous sulfate 325 (65 FE) MG tablet Take 325 mg by mouth daily with breakfast.  . inFLIXimab (REMICADE IV) Inject into the vein every 8 (eight) weeks.  Marland Kitchen levothyroxine (SYNTHROID) 50 MCG tablet TAKE 1 TABLET BY MOUTH EVERY DAY BEFORE BREAKFAST  . lidocaine (XYLOCAINE) 2 % jelly Apply 1 application topically as needed.  . Loperamide HCl (IMODIUM PO) Take by mouth.  . montelukast (SINGULAIR) 10 MG tablet Take 1 tablet (10 mg total) by mouth daily.  . Nutritional Supplements (QUINOA KALE & HEMP) LIQD Place 10 drops under the tongue 2 (two) times daily.  . Potassium 99 MG TABS Take 1 tablet by mouth daily.  . promethazine (PHENERGAN) 25 MG tablet Take 1 tablet (25 mg total) by mouth every 4 (  four) hours as needed.  . sertraline (ZOLOFT) 25 MG tablet TAKE 1 TABLET BY MOUTH THREE TIMES A DAY (Patient taking differently: Takes 4 tablets daily)  . traZODone (DESYREL) 100 MG tablet Take 2 tablets (200 mg total) by mouth at bedtime.  . VENTOLIN HFA 108 (90 Base) MCG/ACT inhaler INHALE 2 PUFFS INTO THE LUNGS EVERY 6 HOURS AS NEEDED FOR WHEEZING OR SHORTNESS OF BREATH  . Vitamin D, Ergocalciferol, (DRISDOL) 1.25 MG (50000 UNIT) CAPS capsule TAKE 1 CAPSULE (50,000 UNITS TOTAL) BY MOUTH EVERY 7 (SEVEN) DAYS.  . ciprofloxacin (CIPRO) 500 MG tablet Take 500 mg by mouth daily.   . fluticasone (FLONASE) 50 MCG/ACT nasal spray 1 spray by Each Nare route daily.  .  metroNIDAZOLE (FLAGYL) 500 MG tablet Take 250 mg by mouth 2 (two) times daily.   . NYSTATIN powder APPLY TO AFFECTED AREA TWICE A DAY (Patient not taking: Reported on 01/05/2021)  . Simethicone 180 MG CAPS Take 180 mg by mouth 2 (two) times daily.   No facility-administered medications prior to visit.    Review of Systems  Constitutional: Negative for appetite change, chills, fatigue and fever.  Respiratory: Negative for chest tightness and shortness of breath.   Cardiovascular: Negative for chest pain and palpitations.  Gastrointestinal: Negative for abdominal pain, nausea and vomiting.  Neurological: Negative for dizziness and weakness.        Objective    BP 106/66 (BP Location: Left Arm, Patient Position: Sitting, Cuff Size: Normal)   Pulse 77   Temp 97.8 F (36.6 C) (Oral)   Resp 16   Wt 137 lb (62.1 kg)   SpO2 98%   BMI 24.27 kg/m  BP Readings from Last 3 Encounters:  01/05/21 106/66  04/14/20 112/72  01/04/20 101/70   Wt Readings from Last 3 Encounters:  01/05/21 137 lb (62.1 kg)  04/14/20 131 lb 3.2 oz (59.5 kg)  01/04/20 123 lb (55.8 kg)       Physical Exam Vitals reviewed.  Constitutional:      General: She is not in acute distress.    Appearance: Normal appearance. She is well-developed and normal weight. She is not ill-appearing or diaphoretic.  HENT:     Head: Normocephalic and atraumatic.     Right Ear: External ear normal.     Left Ear: External ear normal.     Nose: Nose normal.  Eyes:     General:        Right eye: No discharge.        Left eye: No discharge.     Conjunctiva/sclera: Conjunctivae normal.  Neck:     Thyroid: No thyromegaly.     Vascular: No carotid bruit or JVD.     Trachea: No tracheal deviation.  Cardiovascular:     Rate and Rhythm: Normal rate and regular rhythm.     Pulses: Normal pulses.     Heart sounds: Normal heart sounds. No murmur heard. No friction rub. No gallop.   Pulmonary:     Effort: Pulmonary effort is  normal. No respiratory distress.     Breath sounds: Normal breath sounds. No wheezing or rales.  Chest:     Chest wall: No tenderness.  Abdominal:     General: Bowel sounds are normal. There is no distension.     Palpations: Abdomen is soft. There is no mass.     Tenderness: There is no guarding or rebound.     Hernia: No hernia is present.  Musculoskeletal:  General: No tenderness.     Cervical back: Normal range of motion and neck supple.     Right lower leg: No edema.     Left lower leg: No edema.  Lymphadenopathy:     Cervical: No cervical adenopathy.  Skin:    General: Skin is warm and dry.     Capillary Refill: Capillary refill takes less than 2 seconds.     Findings: No rash.  Neurological:     General: No focal deficit present.     Mental Status: She is alert and oriented to person, place, and time. Mental status is at baseline.  Psychiatric:        Mood and Affect: Mood normal.        Behavior: Behavior normal.       No results found for any visits on 01/05/21.  Assessment & Plan     1. Recurrent major depressive disorder, in full remission [facility]) Patient offers a very wandering and lengthy discussion today.  Continue sertraline and trazodone.  Probable lifelong.  Patient is given handicap parking sticker for 6 months.  She will follow-up with someone else in the practice or with another practice in the future as she states they are moving.More than 50% 30 minute visit spent in counseling or coordination of care I do not need to follow-up with this patient at all in the future. - sertraline (ZOLOFT) 25 MG tablet; Take 1 tablet (25 mg total) by mouth 3 (three) times daily.  Dispense: 270 tablet; Refill: 0 - Lipid panel - TSH - CBC w/Diff/Platelet - Comprehensive Metabolic Panel (CMET)  2. Anxiety On sertraline.  3. Exercise-induced asthma with acute exacerbation Patient has not has a rescue inhaler and states she does not need 1.  She requests an oxygen  prescription because she wants 1.  She is O2 sat of 98% today.  I have written a prescription for oxygen but told her it would not be covered by insurance as there is no discernible need for her to be on oxygen at all. - beclomethasone (QVAR) 40 MCG/ACT inhaler; Inhale 2 puffs into the lungs 2 (two) times daily.  Dispense: 8.7 g; Refill: 5 - albuterol (VENTOLIN HFA) 108 (90 Base) MCG/ACT inhaler; Inhale 2 puffs into the lungs every 6 (six) hours as needed for wheezing or shortness of breath.  Dispense: 18 each; Refill: 2 - Lipid panel - TSH - CBC w/Diff/Platelet - Comprehensive Metabolic Panel (CMET)  4. Subclinical hypothyroidism  - levothyroxine (SYNTHROID) 50 MCG tablet; Take 1 tablet (50 mcg total) by mouth daily before breakfast.  Dispense: 90 tablet; Refill: 1 - Lipid panel - TSH - CBC w/Diff/Platelet - Comprehensive Metabolic Panel (CMET)  5. Allergy to environmental factors Sees allergist - montelukast (SINGULAIR) 10 MG tablet; Take 1 tablet (10 mg total) by mouth daily.  Dispense: 90 tablet; Refill: 1 - Lipid panel - TSH - CBC w/Diff/Platelet - Comprehensive Metabolic Panel (CMET)  6. Primary insomnia On trazodone - traZODone (DESYREL) 100 MG tablet; Take 2 tablets (200 mg total) by mouth at bedtime.  Dispense: 180 tablet; Refill: 1 - Lipid panel - TSH - CBC w/Diff/Platelet - Comprehensive Metabolic Panel (CMET)  7. Chronic ulcerative enterocolitis, unspecified complication (HCC) On Remicade per GI  8. Pouchitis ([place])   9. S/P total colectomy    No follow-ups on file.      I, Wilhemena Durie, MD, have reviewed all documentation for this visit. The documentation on 01/09/21 for the exam, diagnosis, procedures, and  orders are all accurate and complete.    Joncarlos Atkison Cranford Mon, MD  [facility] [phone] (phone) [phone] (fax)  [place]

## 2021-01-10 ENCOUNTER — Telehealth: Payer: Self-pay

## 2021-01-10 NOTE — Telephone Encounter (Signed)
Copied from [place] [id number]. Topic: General - Other >> Jan 10, 2021  2:37 PM Celene Kras wrote: Reason for CRM: Pt called stating that she found inaccurate info on her most recent lab results and is requesting to have a nurse give a call to follow up and discuss. Please advise.

## 2021-01-13 DIAGNOSIS — H04123 Dry eye syndrome of bilateral lacrimal glands: Secondary | ICD-10-CM | POA: Diagnosis not present

## 2021-01-16 DIAGNOSIS — K529 Noninfective gastroenteritis and colitis, unspecified: Secondary | ICD-10-CM | POA: Diagnosis not present

## 2021-01-16 DIAGNOSIS — Z79899 Other long term (current) drug therapy: Secondary | ICD-10-CM | POA: Diagnosis not present

## 2021-01-16 DIAGNOSIS — K91850 Pouchitis: Secondary | ICD-10-CM | POA: Diagnosis not present

## 2021-01-27 DIAGNOSIS — E038 Other specified hypothyroidism: Secondary | ICD-10-CM | POA: Diagnosis not present

## 2021-01-27 DIAGNOSIS — Z9109 Other allergy status, other than to drugs and biological substances: Secondary | ICD-10-CM | POA: Diagnosis not present

## 2021-01-27 DIAGNOSIS — J45990 Exercise induced bronchospasm: Secondary | ICD-10-CM | POA: Diagnosis not present

## 2021-01-27 DIAGNOSIS — F3342 Major depressive disorder, recurrent, in full remission: Secondary | ICD-10-CM | POA: Diagnosis not present

## 2021-01-28 LAB — COMPREHENSIVE METABOLIC PANEL
ALT: 14 IU/L (ref 0–32)
AST: 23 IU/L (ref 0–40)
Albumin/Globulin Ratio: 1.8 (ref 1.2–2.2)
Albumin: 4.1 g/dL (ref 3.8–4.9)
Alkaline Phosphatase: 82 IU/L (ref 44–121)
BUN/Creatinine Ratio: 10 (ref 9–23)
BUN: 11 mg/dL (ref 6–24)
Bilirubin Total: <0.2 mg/dL (ref 0.0–1.2)
CO2: 23 mmol/L (ref 20–29)
Calcium: 8.7 mg/dL (ref 8.7–10.2)
Chloride: 104 mmol/L (ref 96–106)
Creatinine, Ser: 1.06 mg/dL — ABNORMAL HIGH (ref 0.57–1.00)
Globulin, Total: 2.3 g/dL (ref 1.5–4.5)
Glucose: 106 mg/dL — ABNORMAL HIGH (ref 65–99)
Potassium: 4.3 mmol/L (ref 3.5–5.2)
Sodium: 140 mmol/L (ref 134–144)
Total Protein: 6.4 g/dL (ref 6.0–8.5)
eGFR: 64 mL/min/{1.73_m2} (ref >59)

## 2021-01-28 LAB — CBC WITH DIFFERENTIAL/PLATELET
Basophils Absolute: 0.1 10*3/uL (ref 0.0–0.2)
Basos: 1 %
EOS (ABSOLUTE): 0.3 10*3/uL (ref 0.0–0.4)
Eos: 3 %
Hematocrit: 36.4 % (ref 34.0–46.6)
Hemoglobin: 11.7 g/dL (ref 11.1–15.9)
Immature Grans (Abs): 0 10*3/uL (ref 0.0–0.1)
Immature Granulocytes: 0 %
Lymphocytes Absolute: 2.7 10*3/uL (ref 0.7–3.1)
Lymphs: 28 %
MCH: 27.5 pg (ref 26.6–33.0)
MCHC: 32.1 g/dL (ref 31.5–35.7)
MCV: 86 fL (ref 79–97)
Monocytes Absolute: 0.6 10*3/uL (ref 0.1–0.9)
Monocytes: 7 %
Neutrophils Absolute: 5.6 10*3/uL (ref 1.4–7.0)
Neutrophils: 61 %
Platelets: 274 10*3/uL (ref 150–450)
RBC: 4.25 x10E6/uL (ref 3.77–5.28)
RDW: 12.7 % (ref 11.7–15.4)
WBC: 9.4 10*3/uL (ref 3.4–10.8)

## 2021-01-28 LAB — LIPID PANEL
Chol/HDL Ratio: 5.8 ratio — ABNORMAL HIGH (ref 0.0–4.4)
Cholesterol, Total: 250 mg/dL — ABNORMAL HIGH (ref 100–199)
HDL: 43 mg/dL (ref >39)
LDL Chol Calc (NIH): 143 mg/dL — ABNORMAL HIGH (ref 0–99)
Triglycerides: 349 mg/dL — ABNORMAL HIGH (ref 0–149)
VLDL Cholesterol Cal: 64 mg/dL — ABNORMAL HIGH (ref 5–40)

## 2021-01-28 LAB — TSH: TSH: 0.468 u[IU]/mL (ref 0.450–4.500)

## 2021-03-13 DIAGNOSIS — K91850 Pouchitis: Secondary | ICD-10-CM | POA: Diagnosis not present

## 2021-04-16 ENCOUNTER — Other Ambulatory Visit: Payer: Self-pay | Admitting: Physician Assistant

## 2021-04-16 DIAGNOSIS — E46 Unspecified protein-calorie malnutrition: Secondary | ICD-10-CM

## 2021-04-18 DIAGNOSIS — J309 Allergic rhinitis, unspecified: Secondary | ICD-10-CM | POA: Diagnosis not present

## 2021-04-18 DIAGNOSIS — J340 Abscess, furuncle and carbuncle of nose: Secondary | ICD-10-CM | POA: Diagnosis not present

## 2021-04-24 ENCOUNTER — Telehealth: Payer: Self-pay | Admitting: Family Medicine

## 2021-04-24 NOTE — Telephone Encounter (Signed)
Just did PA on Sertraline today. Waiting on Response.

## 2021-04-24 NOTE — Telephone Encounter (Signed)
Pt has paid out of pocket for sertraline 25 mg and she takes 4 pills a day instead of 3 pills a day  and she splits sertraline through out the day. Pt would like PA please call [phone].

## 2021-05-09 DIAGNOSIS — K91850 Pouchitis: Secondary | ICD-10-CM | POA: Diagnosis not present

## 2021-05-10 DIAGNOSIS — K91850 Pouchitis: Secondary | ICD-10-CM | POA: Diagnosis not present

## 2021-06-20 DIAGNOSIS — K91850 Pouchitis: Secondary | ICD-10-CM | POA: Diagnosis not present

## 2021-07-31 DIAGNOSIS — R0602 Shortness of breath: Secondary | ICD-10-CM | POA: Diagnosis not present

## 2021-07-31 DIAGNOSIS — F419 Anxiety disorder, unspecified: Secondary | ICD-10-CM | POA: Diagnosis not present

## 2021-08-09 DIAGNOSIS — Z8719 Personal history of other diseases of the digestive system: Secondary | ICD-10-CM | POA: Diagnosis not present

## 2021-08-09 DIAGNOSIS — K91850 Pouchitis: Secondary | ICD-10-CM | POA: Diagnosis not present

## 2021-08-23 ENCOUNTER — Other Ambulatory Visit: Payer: Self-pay | Admitting: Family Medicine

## 2021-08-23 DIAGNOSIS — E038 Other specified hypothyroidism: Secondary | ICD-10-CM

## 2021-08-23 DIAGNOSIS — E46 Unspecified protein-calorie malnutrition: Secondary | ICD-10-CM

## 2021-08-23 NOTE — Telephone Encounter (Signed)
Requested medication (s) are due for refill today: yes  Requested medication (s) are on the active medication list: yes  Last refill:  04/19/21 #12/1RF  Future visit scheduled: Yes  Notes to clinic:  Unable to refill per protocol, cannot delegate.      Requested Prescriptions  Pending Prescriptions Disp Refills   Vitamin D, Ergocalciferol, (DRISDOL) 1.25 MG (50000 UNIT) CAPS capsule [Pharmacy Med Name: VITAMIN D2 1.25MG (50,000 UNIT)] 12 capsule 1    Sig: TAKE 1 CAPSULE (50,000 UNITS TOTAL) BY MOUTH EVERY 7 (SEVEN) DAYS     Endocrinology:  Vitamins - Vitamin D Supplementation Failed - 08/23/2021 12:15 PM      Failed - 50,000 IU strengths are not delegated      Failed - Phosphate in normal range and within 360 days    No results found for: PHOS        Failed - Vitamin D in normal range and within 360 days    Vitamin D2 1, 25 (OH)2  Date Value Ref Range Status  06/05/2016 35 pg/mL Final   Vitamin D3 1, 25 (OH)2  Date Value Ref Range Status  06/05/2016 11 pg/mL Final   Vitamin D 1, 25 (OH)2 Total  Date Value Ref Range Status  06/05/2016 46 pg/mL Final    Comment:    Reference Range: Adults: 21 - 65    Vit D, 25-Hydroxy  Date Value Ref Range Status  01/04/2020 53.6 30.0 - 100.0 ng/mL Final    Comment:    Vitamin D deficiency has been defined by the Institute of Medicine and an Endocrine Society practice guideline as a level of serum 25-OH vitamin D less than 20 ng/mL (1,2). The Endocrine Society went on to further define vitamin D insufficiency as a level between 21 and 29 ng/mL (2). 1. IOM (Institute of Medicine). 2010. Dietary reference    intakes for calcium and D. [place]: The    [employer]. 2. Holick MF, Binkley [place], Bischoff-Ferrari HA, et al.    Evaluation, treatment, and prevention of vitamin D    deficiency: an Endocrine Society clinical practice    guideline. JCEM. 2011 Jul; 96(7):1911-30.           Passed - Ca in normal range and  within 360 days    Calcium  Date Value Ref Range Status  01/27/2021 8.7 8.7 - 10.2 mg/dL Final   Calcium, Total  Date Value Ref Range Status  10/18/2012 9.0 8.5 - 10.1 mg/dL Final          Passed - Valid encounter within last 12 months    Recent Outpatient Visits           7 months ago Recurrent major depressive disorder, in full remission [facility])   [facility] Jerrol Banana., MD   1 year ago Annual physical exam   [facility] [place], Cle[place] Sorrel, [place]   2 years ago Annual physical exam   [place], [place], [place]   3 years ago Annual physical exam   [facility] [place], [place]   5 years ago Subclinical hypothyroidism   [facility] [place], [place]               levothyroxine (SYNTHROID) 50 MCG tablet [Pharmacy Med Name: LEVOTHYROXINE 50 MCG TABLET] 90 tablet 1    Sig: TAKE 1 TABLET BY MOUTH DAILY BEFORE BREAKFAST     Endocrinology:  Hypothyroid Agents Failed - 08/23/2021  12:15 PM      Failed - TSH needs to be rechecked within 3 months after an abnormal result. Refill until TSH is due.      Passed - TSH in normal range and within 360 days    TSH  Date Value Ref Range Status  01/27/2021 0.468 0.450 - 4.500 uIU/mL Final          Passed - Valid encounter within last 12 months    Recent Outpatient Visits           7 months ago Recurrent major depressive disorder, in full remission [facility])   [facility] Jerrol Banana., MD   1 year ago Annual physical exam   [facility] [place], Clearn[place], [place]   2 years ago Annual physical exam   [facility] [place], [place], [place]   3 years ago Annual physical exam   [facility] [place], [place]   5 years ago Subclinical hypothyroidism   [facility] [place], [place], [place]

## 2021-08-30 ENCOUNTER — Ambulatory Visit: Payer: BC Managed Care – PPO | Admitting: Family Medicine

## 2021-08-30 ENCOUNTER — Other Ambulatory Visit: Payer: Self-pay

## 2021-08-30 ENCOUNTER — Encounter: Payer: Self-pay | Admitting: Family Medicine

## 2021-08-30 VITALS — BP 119/69 | HR 76 | Temp 98.4°F | Resp 16 | Ht 63.0 in | Wt 129.0 lb

## 2021-08-30 DIAGNOSIS — D229 Melanocytic nevi, unspecified: Secondary | ICD-10-CM | POA: Diagnosis not present

## 2021-08-30 DIAGNOSIS — R5383 Other fatigue: Secondary | ICD-10-CM | POA: Diagnosis not present

## 2021-08-30 DIAGNOSIS — R5382 Chronic fatigue, unspecified: Secondary | ICD-10-CM | POA: Diagnosis not present

## 2021-08-30 NOTE — Progress Notes (Signed)
° °  SUBJECTIVE:   CHIEF COMPLAINT / HPI:   MOLE CHECK - noticing change and enlargement of mole on R ear since yesterday. - denies itching, bleeding, scale, oozing, pain - FH of skin cancer - previous precancerous lesions. - wants to be established at [place] - with breathlessness. With exertion and with flying in airplane.  - h/o asthma - also noticing more PVCs. Duration:   1.5 years Onset: gradual Symptoms improve with rest: yes with prolonged rest Depressive symptoms: has h/o depression but reports no depressive sx. Stress/anxiety:  recent increase in stress Insomnia:  primarily due to pouchitis Snoring: yes Observed apnea by bed partner:  will often "hold her breath" prior sleep study normal 10-15 years ago.  Daytime hypersomnolence: does nap during day Wakes feeling refreshed: yes History of sleep study: yes 10-15 years ago Dysnea on exertion:  yes Orthopnea/PND: no Chest pain: no Chronic cough:  with asmatic triggers Lower extremity edema: no Arthralgias:yes Myalgias:  sometimes when dehydrated. Rash: no Has appt in April with Cardiology. No FH of early cardiac disease.  Non smoker.    OBJECTIVE:   BP 119/69 (BP Location: Right Arm, Patient Position: Sitting, Cuff Size: Normal)    Pulse 76    Temp 98.4 F (36.9 C) (Temporal)    Resp 16    Ht 5' 3"  (1.6 m)    Wt 129 lb (58.5 kg)    SpO2 98%    BMI 22.85 kg/m   Gen: well appearing, in NAD Card: RRR Lungs: CTAB Ext: WWP, no edema Skin: flat, well circumscribed nevus to R ear helix  ASSESSMENT/PLAN:   Change in mole Benign appearance. Referral generated for establishment per patient request.  Fatigue Unclear etiology. Exam wnl today. Will obtain labs to assess for contributors given prior h/o anemia, subclinical hypothyroidism. If unrevealing consider updated GAD/PHQ or PFT given symptoms occur with exertion and h/o asthma.     Myles Gip, DO

## 2021-08-30 NOTE — Assessment & Plan Note (Signed)
Benign appearance. Referral generated for establishment per patient request.

## 2021-08-30 NOTE — Patient Instructions (Signed)
It was great to see you!  Our plans for today:  - We are checking some labs today, we will release these results to your MyChart. - Let us know if you don't hear about a dermatology appointment in the next few weeks.  Take care and seek immediate care sooner if you develop any concerns.   Dr. Ky Barban

## 2021-08-30 NOTE — Assessment & Plan Note (Signed)
Unclear etiology. Exam wnl today. Will obtain labs to assess for contributors given prior h/o anemia, subclinical hypothyroidism. If unrevealing consider updated GAD/PHQ or PFT given symptoms occur with exertion and h/o asthma.

## 2021-08-31 LAB — CBC WITH DIFFERENTIAL/PLATELET
Basophils Absolute: 0.1 10*3/uL (ref 0.0–0.2)
Basos: 1 %
EOS (ABSOLUTE): 0.4 10*3/uL (ref 0.0–0.4)
Eos: 5 %
Hematocrit: 36.8 % (ref 34.0–46.6)
Hemoglobin: 12.2 g/dL (ref 11.1–15.9)
Immature Grans (Abs): 0 10*3/uL (ref 0.0–0.1)
Immature Granulocytes: 0 %
Lymphocytes Absolute: 2.1 10*3/uL (ref 0.7–3.1)
Lymphs: 28 %
MCH: 28 pg (ref 26.6–33.0)
MCHC: 33.2 g/dL (ref 31.5–35.7)
MCV: 85 fL (ref 79–97)
Monocytes Absolute: 0.6 10*3/uL (ref 0.1–0.9)
Monocytes: 9 %
Neutrophils Absolute: 4.1 10*3/uL (ref 1.4–7.0)
Neutrophils: 57 %
Platelets: 218 10*3/uL (ref 150–450)
RBC: 4.35 x10E6/uL (ref 3.77–5.28)
RDW: 14.2 % (ref 11.7–15.4)
WBC: 7.3 10*3/uL (ref 3.4–10.8)

## 2021-08-31 LAB — COMPREHENSIVE METABOLIC PANEL
ALT: 15 IU/L (ref 0–32)
AST: 20 IU/L (ref 0–40)
Albumin/Globulin Ratio: 1.9 (ref 1.2–2.2)
Albumin: 4.2 g/dL (ref 3.8–4.9)
Alkaline Phosphatase: 92 IU/L (ref 44–121)
BUN/Creatinine Ratio: 18 (ref 9–23)
BUN: 15 mg/dL (ref 6–24)
Bilirubin Total: <0.2 mg/dL (ref 0.0–1.2)
CO2: 25 mmol/L (ref 20–29)
Calcium: 9 mg/dL (ref 8.7–10.2)
Chloride: 102 mmol/L (ref 96–106)
Creatinine, Ser: 0.82 mg/dL (ref 0.57–1.00)
Globulin, Total: 2.2 g/dL (ref 1.5–4.5)
Glucose: 90 mg/dL (ref 70–99)
Potassium: 3.8 mmol/L (ref 3.5–5.2)
Sodium: 143 mmol/L (ref 134–144)
Total Protein: 6.4 g/dL (ref 6.0–8.5)
eGFR: 86 mL/min/{1.73_m2} (ref >59)

## 2021-08-31 LAB — VITAMIN B12: Vitamin B-12: 281 pg/mL (ref 232–1245)

## 2021-08-31 LAB — MAGNESIUM: Magnesium: 2.1 mg/dL (ref 1.6–2.3)

## 2021-08-31 LAB — TSH: TSH: 0.831 u[IU]/mL (ref 0.450–4.500)

## 2021-09-01 ENCOUNTER — Telehealth: Payer: Self-pay | Admitting: Physician Assistant

## 2021-09-01 NOTE — Telephone Encounter (Signed)
Pt is calling to discuss medication based on her lab results. Pt is wanting to know should her levothyroxine (SYNTHROID) 50 MCG tablet [[id number]] been increased? Her TSH was on the lowest of the range.  Pt added a B-12 gummies 3,000 mcg. Does Dr. Ky Barban then this approia ? Please advise  Pt has no large intestine and breathing issues with exertion, pt is over 50 and has thyroid concerns   Is a B-12 injection appropriate.  CB- [phone]

## 2021-09-06 DIAGNOSIS — K529 Noninfective gastroenteritis and colitis, unspecified: Secondary | ICD-10-CM | POA: Diagnosis not present

## 2021-09-06 NOTE — Telephone Encounter (Signed)
Lm on vm advising as below. PEC please advise when patient calls back.

## 2021-09-06 NOTE — Telephone Encounter (Signed)
Patient returned call. Would like 3 things addressed with PCP if possible: When can patient get B12 injection? Today or tomorrow? And then monthly .  Wants pulmonologist referral to [facility] I[facility]: would like to have surgery this month while husband if off from work. Is it safe to have surgery due to her pulmonary status? Patient anticipating surgery this month.  Can results be forwarded to [facility] Patient would like a call back when answers to question can be provided.

## 2021-10-03 DIAGNOSIS — Z01818 Encounter for other preprocedural examination: Secondary | ICD-10-CM | POA: Diagnosis not present

## 2021-10-03 DIAGNOSIS — Z7189 Other specified counseling: Secondary | ICD-10-CM | POA: Diagnosis not present

## 2021-10-04 DIAGNOSIS — E785 Hyperlipidemia, unspecified: Secondary | ICD-10-CM | POA: Diagnosis not present

## 2021-10-04 DIAGNOSIS — E039 Hypothyroidism, unspecified: Secondary | ICD-10-CM | POA: Diagnosis not present

## 2021-10-04 DIAGNOSIS — I493 Ventricular premature depolarization: Secondary | ICD-10-CM | POA: Diagnosis not present

## 2021-10-04 DIAGNOSIS — R0602 Shortness of breath: Secondary | ICD-10-CM | POA: Diagnosis not present

## 2021-10-06 DIAGNOSIS — J45909 Unspecified asthma, uncomplicated: Secondary | ICD-10-CM | POA: Diagnosis not present

## 2021-10-06 DIAGNOSIS — Z881 Allergy status to other antibiotic agents status: Secondary | ICD-10-CM | POA: Diagnosis not present

## 2021-10-06 DIAGNOSIS — K529 Noninfective gastroenteritis and colitis, unspecified: Secondary | ICD-10-CM | POA: Diagnosis not present

## 2021-10-06 DIAGNOSIS — Z9049 Acquired absence of other specified parts of digestive tract: Secondary | ICD-10-CM | POA: Diagnosis not present

## 2021-10-06 DIAGNOSIS — Z792 Long term (current) use of antibiotics: Secondary | ICD-10-CM | POA: Diagnosis not present

## 2021-10-06 DIAGNOSIS — R0602 Shortness of breath: Secondary | ICD-10-CM | POA: Diagnosis not present

## 2021-10-06 DIAGNOSIS — E039 Hypothyroidism, unspecified: Secondary | ICD-10-CM | POA: Diagnosis not present

## 2021-10-06 DIAGNOSIS — Z883 Allergy status to other anti-infective agents status: Secondary | ICD-10-CM | POA: Diagnosis not present

## 2021-10-06 DIAGNOSIS — F32A Depression, unspecified: Secondary | ICD-10-CM | POA: Diagnosis not present

## 2021-10-06 DIAGNOSIS — F419 Anxiety disorder, unspecified: Secondary | ICD-10-CM | POA: Diagnosis not present

## 2021-10-06 DIAGNOSIS — Z91048 Other nonmedicinal substance allergy status: Secondary | ICD-10-CM | POA: Diagnosis not present

## 2021-10-06 DIAGNOSIS — Z885 Allergy status to narcotic agent status: Secondary | ICD-10-CM | POA: Diagnosis not present

## 2021-10-06 DIAGNOSIS — K56609 Unspecified intestinal obstruction, unspecified as to partial versus complete obstruction: Secondary | ICD-10-CM | POA: Diagnosis not present

## 2021-10-06 DIAGNOSIS — Z888 Allergy status to other drugs, medicaments and biological substances status: Secondary | ICD-10-CM | POA: Diagnosis not present

## 2021-10-06 DIAGNOSIS — Z20822 Contact with and (suspected) exposure to covid-19: Secondary | ICD-10-CM | POA: Diagnosis not present

## 2021-10-06 DIAGNOSIS — Z882 Allergy status to sulfonamides status: Secondary | ICD-10-CM | POA: Diagnosis not present

## 2021-10-06 DIAGNOSIS — K5190 Ulcerative colitis, unspecified, without complications: Secondary | ICD-10-CM | POA: Diagnosis not present

## 2021-10-06 DIAGNOSIS — K91850 Pouchitis: Secondary | ICD-10-CM | POA: Diagnosis not present

## 2021-10-06 DIAGNOSIS — Z79899 Other long term (current) drug therapy: Secondary | ICD-10-CM | POA: Diagnosis not present

## 2021-10-19 DIAGNOSIS — Z932 Ileostomy status: Secondary | ICD-10-CM | POA: Diagnosis not present

## 2021-10-19 DIAGNOSIS — I493 Ventricular premature depolarization: Secondary | ICD-10-CM | POA: Diagnosis not present

## 2021-10-19 DIAGNOSIS — R0602 Shortness of breath: Secondary | ICD-10-CM | POA: Diagnosis not present

## 2021-10-19 DIAGNOSIS — E039 Hypothyroidism, unspecified: Secondary | ICD-10-CM | POA: Diagnosis not present

## 2021-10-19 DIAGNOSIS — E538 Deficiency of other specified B group vitamins: Secondary | ICD-10-CM | POA: Diagnosis not present

## 2021-10-19 DIAGNOSIS — E785 Hyperlipidemia, unspecified: Secondary | ICD-10-CM | POA: Diagnosis not present

## 2021-10-19 DIAGNOSIS — R300 Dysuria: Secondary | ICD-10-CM | POA: Diagnosis not present

## 2021-10-19 DIAGNOSIS — K91850 Pouchitis: Secondary | ICD-10-CM | POA: Diagnosis not present

## 2021-10-26 DIAGNOSIS — K5190 Ulcerative colitis, unspecified, without complications: Secondary | ICD-10-CM | POA: Diagnosis not present

## 2021-10-26 DIAGNOSIS — Z932 Ileostomy status: Secondary | ICD-10-CM | POA: Diagnosis not present

## 2021-10-27 DIAGNOSIS — F419 Anxiety disorder, unspecified: Secondary | ICD-10-CM | POA: Diagnosis not present

## 2021-10-27 DIAGNOSIS — E538 Deficiency of other specified B group vitamins: Secondary | ICD-10-CM | POA: Diagnosis not present

## 2021-10-27 DIAGNOSIS — Z8719 Personal history of other diseases of the digestive system: Secondary | ICD-10-CM | POA: Diagnosis not present

## 2021-10-27 DIAGNOSIS — Z79899 Other long term (current) drug therapy: Secondary | ICD-10-CM | POA: Diagnosis not present

## 2021-10-29 DIAGNOSIS — K9410 Enterostomy complication, unspecified: Secondary | ICD-10-CM | POA: Diagnosis not present

## 2021-10-29 DIAGNOSIS — K9409 Other complications of colostomy: Secondary | ICD-10-CM | POA: Diagnosis not present

## 2021-10-29 DIAGNOSIS — Z433 Encounter for attention to colostomy: Secondary | ICD-10-CM | POA: Diagnosis not present

## 2021-10-29 DIAGNOSIS — Z9889 Other specified postprocedural states: Secondary | ICD-10-CM | POA: Diagnosis not present

## 2021-10-29 DIAGNOSIS — K9419 Other complications of enterostomy: Secondary | ICD-10-CM | POA: Diagnosis not present

## 2021-10-29 DIAGNOSIS — Y833 Surgical operation with formation of external stoma as the cause of abnormal reaction of the patient, or of later complication, without mention of misadventure at the time of the procedure: Secondary | ICD-10-CM | POA: Diagnosis not present

## 2021-10-29 DIAGNOSIS — Z20822 Contact with and (suspected) exposure to covid-19: Secondary | ICD-10-CM | POA: Diagnosis not present

## 2021-10-29 DIAGNOSIS — Z432 Encounter for attention to ileostomy: Secondary | ICD-10-CM | POA: Diagnosis not present

## 2021-10-30 ENCOUNTER — Telehealth: Payer: Self-pay | Admitting: Physician Assistant

## 2021-10-30 DIAGNOSIS — E038 Other specified hypothyroidism: Secondary | ICD-10-CM

## 2021-11-01 DIAGNOSIS — Z79899 Other long term (current) drug therapy: Secondary | ICD-10-CM | POA: Diagnosis not present

## 2021-11-01 DIAGNOSIS — R5383 Other fatigue: Secondary | ICD-10-CM | POA: Diagnosis not present

## 2021-11-01 DIAGNOSIS — Z9049 Acquired absence of other specified parts of digestive tract: Secondary | ICD-10-CM | POA: Diagnosis not present

## 2021-11-01 DIAGNOSIS — Z9089 Acquired absence of other organs: Secondary | ICD-10-CM | POA: Diagnosis not present

## 2021-11-01 DIAGNOSIS — Z48815 Encounter for surgical aftercare following surgery on the digestive system: Secondary | ICD-10-CM | POA: Diagnosis not present

## 2021-11-01 DIAGNOSIS — Z90710 Acquired absence of both cervix and uterus: Secondary | ICD-10-CM | POA: Diagnosis not present

## 2021-11-02 MED ORDER — LEVOTHYROXINE SODIUM 50 MCG PO TABS: 50.0000 ug | ORAL_TABLET | Freq: Every day | ORAL | 2 refills | Status: AC

## 2021-11-02 NOTE — Telephone Encounter (Addendum)
Pt is calling back about refill request for Levothyroxine / I advised her that she needed an appt / she stated that she has an appt in 2 months with another provider under [facility] but needs refills until then and that there should be recent blood work history/ she didn't want to schedule an appt / pt wanted to hear back about this from her call this morning within an hr/ pt is asking for enough to last until appt in 2 months/ she said she will call back in 20 minutes / please advise  ?

## 2021-11-02 NOTE — Telephone Encounter (Signed)
Lm on vm advising that medication was sent to pharmacy and to call back to schedule a f/u appt.  ?

## 2021-11-02 NOTE — Addendum Note (Signed)
Addended by: Shawna Orleans on: 11/02/2021 04:54 PM ? ? Modules accepted: Orders ? ?

## 2021-11-02 NOTE — Telephone Encounter (Signed)
Pt called back checking on her prescription and I told her that it has been sent to Dr. Jacinto Reap.  She said she would really like it called in today..She is completely out.  ?

## 2021-11-02 NOTE — Telephone Encounter (Signed)
Patient states at her last OV with Dr. Rosanna Randy she was under the impression who would send in a year supply of levothyroxine (SYNTHROID) 50 MCG tablet. Patient states she just had ileoscopy surgery and she really needs this remain on her medications to recovery properly. Patient states she is completely out and pharmacy sent in request on 10/30/2021, patient would like to hear back from a nurse in 1 hour ? ? ?[id number] [place]  [phone] ?Fax:  [phone] ?  ? ?

## 2021-11-02 NOTE — Telephone Encounter (Signed)
Pt called back checking on  ?

## 2021-11-02 NOTE — Telephone Encounter (Signed)
Please refill as requested. She should schedule f/u visit with a new PCP (one of the new APPs)

## 2021-11-03 DIAGNOSIS — Z8719 Personal history of other diseases of the digestive system: Secondary | ICD-10-CM | POA: Diagnosis not present

## 2021-11-14 DIAGNOSIS — Z932 Ileostomy status: Secondary | ICD-10-CM | POA: Diagnosis not present

## 2021-11-14 DIAGNOSIS — K5190 Ulcerative colitis, unspecified, without complications: Secondary | ICD-10-CM | POA: Diagnosis not present

## 2021-11-24 DIAGNOSIS — K5190 Ulcerative colitis, unspecified, without complications: Secondary | ICD-10-CM | POA: Diagnosis not present

## 2021-11-24 DIAGNOSIS — Z932 Ileostomy status: Secondary | ICD-10-CM | POA: Diagnosis not present

## 2021-11-26 ENCOUNTER — Other Ambulatory Visit: Payer: Self-pay | Admitting: Family Medicine

## 2021-11-26 DIAGNOSIS — E038 Other specified hypothyroidism: Secondary | ICD-10-CM

## 2021-12-03 ENCOUNTER — Other Ambulatory Visit: Payer: Self-pay | Admitting: Family Medicine

## 2021-12-03 DIAGNOSIS — E46 Unspecified protein-calorie malnutrition: Secondary | ICD-10-CM

## 2021-12-05 NOTE — Telephone Encounter (Signed)
Requested medication (s) are due for refill today: yes ? ?Requested medication (s) are on the active medication list: yes ? ?Last refill:  08/25/21 #12/0 ? ?Future visit scheduled: no ? ?Notes to clinic:  Unable to refill per protocol, cannot delegate. ? ? ?  ?Requested Prescriptions  ?Pending Prescriptions Disp Refills  ? Vitamin D, Ergocalciferol, (DRISDOL) 1.25 MG (50000 UNIT) CAPS capsule [Pharmacy Med Name: VITAMIN D2 1.25MG(50,000 UNIT)] 12 capsule 0  ?  Sig: TAKE 1 CAPSULE (50,000 UNITS) BY MOUTH EVERY 7 (SEVEN) DAYS. OFFICE VISIT NEEDED FOR FURTHER REFILLS  ?  ? Endocrinology:  Vitamins - Vitamin D Supplementation 2 Failed - 12/03/2021 12:31 PM  ?  ?  Failed - Manual Review: Route requests for 50,000 IU strength to the provider  ?  ?  Failed - Vitamin D in normal range and within 360 days  ?  Vitamin D2 1, 25 (OH)2  ?Date Value Ref Range Status  ?06/05/2016 35 pg/mL Final  ? ?Vitamin D3 1, 25 (OH)2  ?Date Value Ref Range Status  ?06/05/2016 11 pg/mL Final  ? ?Vitamin D 1, 25 (OH)2 Total  ?Date Value Ref Range Status  ?06/05/2016 46 pg/mL Final  ?  Comment:  ?  Reference Range: ?Adults: 21 - 36 ?  ? ?Vit D, 25-Hydroxy  ?Date Value Ref Range Status  ?01/04/2020 53.6 30.0 - 100.0 ng/mL Final  ?  Comment:  ?  Vitamin D deficiency has been defined by the Institute of ?Medicine and an Endocrine Society practice guideline as a ?level of serum 25-OH vitamin D less than 20 ng/mL (1,2). ?The Endocrine Society went on to further define vitamin D ?insufficiency as a level between 21 and 29 ng/mL (2). ?1. IOM (Institute of Medicine). 2010. Dietary reference ?   intakes for calcium and D. [place]: The ?   [employer]. ?2. Holick MF, Binkley [place], Bischoff-Ferrari HA, et al. ?   Evaluation, treatment, and prevention of vitamin D ?   deficiency: an Endocrine Society clinical practice ?   guideline. JCEM. 2011 Jul; 96(7):1911-30. ?  ?  ?  ?  ?  Passed - Ca in normal range and within 360 days  ?  Calcium  ?Date  Value Ref Range Status  ?08/30/2021 9.0 8.7 - 10.2 mg/dL Final  ? ?Calcium, Total  ?Date Value Ref Range Status  ?10/18/2012 9.0 8.5 - 10.1 mg/dL Final  ?  ?  ?  ?  Passed - Valid encounter within last 12 months  ?  Recent Outpatient Visits   ? ?      ? 3 months ago Fatigue, unspecified type  ? [place], [place]  ? 11 months ago Recurrent major depressive disorder, in full remission ([place])  ? [facility] Jerrol Banana., MD  ? 1 year ago Annual physical exam  ? [facility] [place], [place], [place]  ? 3 years ago Annual physical exam  ? [facility] [place], [place], [place]  ? 4 years ago Annual physical exam  ? [facility] [place], [place], [place]  ? ?  ?  ? ?  ?  ?  ? ?

## 2021-12-06 DIAGNOSIS — R0602 Shortness of breath: Secondary | ICD-10-CM | POA: Diagnosis not present

## 2021-12-06 DIAGNOSIS — K529 Noninfective gastroenteritis and colitis, unspecified: Secondary | ICD-10-CM | POA: Diagnosis not present

## 2021-12-06 DIAGNOSIS — R0789 Other chest pain: Secondary | ICD-10-CM | POA: Diagnosis not present

## 2021-12-06 DIAGNOSIS — I493 Ventricular premature depolarization: Secondary | ICD-10-CM | POA: Diagnosis not present

## 2021-12-07 NOTE — Telephone Encounter (Signed)
Requested medication (s) are due for refill today - unsure ? ?Requested medication (s) are on the active medication list yes ? ?Future visit scheduled -no ? ?Last refill: 08/25/21 #12 ? ?Notes to clinic: Request RF: RF requires manual review, last RF has notes ? ?Requested Prescriptions  ?Pending Prescriptions Disp Refills  ? Vitamin D, Ergocalciferol, (DRISDOL) 1.25 MG (50000 UNIT) CAPS capsule [Pharmacy Med Name: VITAMIN D2 1.25MG(50,000 UNIT)] 12 capsule 0  ?  Sig: TAKE 1 CAPSULE (50,000 UNITS) BY MOUTH EVERY 7 (SEVEN) DAYS. OFFICE VISIT NEEDED FOR FURTHER REFILLS  ?  ? Endocrinology:  Vitamins - Vitamin D Supplementation 2 Failed - 12/07/2021  9:55 AM  ?  ?  Failed - Manual Review: Route requests for 50,000 IU strength to the provider  ?  ?  Failed - Vitamin D in normal range and within 360 days  ?  Vitamin D2 1, 25 (OH)2  ?Date Value Ref Range Status  ?06/05/2016 35 pg/mL Final  ? ?Vitamin D3 1, 25 (OH)2  ?Date Value Ref Range Status  ?06/05/2016 11 pg/mL Final  ? ?Vitamin D 1, 25 (OH)2 Total  ?Date Value Ref Range Status  ?06/05/2016 46 pg/mL Final  ?  Comment:  ?  Reference Range: ?Adults: 21 - 47 ?  ? ?Vit D, 25-Hydroxy  ?Date Value Ref Range Status  ?01/04/2020 53.6 30.0 - 100.0 ng/mL Final  ?  Comment:  ?  Vitamin D deficiency has been defined by the Institute of ?Medicine and an Endocrine Society practice guideline as a ?level of serum 25-OH vitamin D less than 20 ng/mL (1,2). ?The Endocrine Society went on to further define vitamin D ?insufficiency as a level between 21 and 29 ng/mL (2). ?1. IOM (Institute of Medicine). 2010. Dietary reference ?   intakes for calcium and D. [place]: The ?   [employer]. ?2. Holick MF, Binkley [place], Bischoff-Ferrari HA, et al. ?   Evaluation, treatment, and prevention of vitamin D ?   deficiency: an Endocrine Society clinical practice ?   guideline. JCEM. 2011 Jul; 96(7):1911-30. ?  ?  ?  ?  ?  Passed - Ca in normal range and within 360 days  ?  Calcium   ?Date Value Ref Range Status  ?08/30/2021 9.0 8.7 - 10.2 mg/dL Final  ? ?Calcium, Total  ?Date Value Ref Range Status  ?10/18/2012 9.0 8.5 - 10.1 mg/dL Final  ?  ?  ?  ?  Passed - Valid encounter within last 12 months  ?  Recent Outpatient Visits   ? ?      ? 3 months ago Fatigue, unspecified type  ? [place], DO  ? 11 months ago Recurrent major depressive disorder, in full remission ([place])  ? [facility] Jerrol Banana., MD  ? 1 year ago Annual physical exam  ? [facility] [place], Cle[place] Sorrel, [place]  ? 3 years ago Annual physical exam  ? [facility] [place], [place], [place]  ? 4 years ago Annual physical exam  ? [facility] [place], [place], [place]  ? ?  ?  ? ?  ?  ?  ? ? ? ?Requested Prescriptions  ?Pending Prescriptions Disp Refills  ? Vitamin D, Ergocalciferol, (DRISDOL) 1.25 MG (50000 UNIT) CAPS capsule [Pharmacy Med Name: VITAMIN D2 1.25MG(50,000 UNIT)] 12 capsule 0  ?  Sig: TAKE 1 CAPSULE (50,000 UNITS) BY MOUTH EVERY 7 (SEVEN) DAYS. OFFICE VISIT NEEDED FOR FURTHER REFILLS  ?  ?  Endocrinology:  Vitamins - Vitamin D Supplementation 2 Failed - 12/07/2021  9:55 AM  ?  ?  Failed - Manual Review: Route requests for 50,000 IU strength to the provider  ?  ?  Failed - Vitamin D in normal range and within 360 days  ?  Vitamin D2 1, 25 (OH)2  ?Date Value Ref Range Status  ?06/05/2016 35 pg/mL Final  ? ?Vitamin D3 1, 25 (OH)2  ?Date Value Ref Range Status  ?06/05/2016 11 pg/mL Final  ? ?Vitamin D 1, 25 (OH)2 Total  ?Date Value Ref Range Status  ?06/05/2016 46 pg/mL Final  ?  Comment:  ?  Reference Range: ?Adults: 21 - 38 ?  ? ?Vit D, 25-Hydroxy  ?Date Value Ref Range Status  ?01/04/2020 53.6 30.0 - 100.0 ng/mL Final  ?  Comment:  ?  Vitamin D deficiency has been defined by the Institute of ?Medicine and an Endocrine Society practice guideline as a ?level of serum 25-OH vitamin D less than 20 ng/mL (1,2). ?The  Endocrine Society went on to further define vitamin D ?insufficiency as a level between 21 and 29 ng/mL (2). ?1. IOM (Institute of Medicine). 2010. Dietary reference ?   intakes for calcium and D. [place]: The ?   [employer]. ?2. Holick MF, Binkley [place], Bischoff-Ferrari HA, et al. ?   Evaluation, treatment, and prevention of vitamin D ?   deficiency: an Endocrine Society clinical practice ?   guideline. JCEM. 2011 Jul; 96(7):1911-30. ?  ?  ?  ?  ?  Passed - Ca in normal range and within 360 days  ?  Calcium  ?Date Value Ref Range Status  ?08/30/2021 9.0 8.7 - 10.2 mg/dL Final  ? ?Calcium, Total  ?Date Value Ref Range Status  ?10/18/2012 9.0 8.5 - 10.1 mg/dL Final  ?  ?  ?  ?  Passed - Valid encounter within last 12 months  ?  Recent Outpatient Visits   ? ?      ? 3 months ago Fatigue, unspecified type  ? [place], DO  ? 11 months ago Recurrent major depressive disorder, in full remission ([place])  ? [facility] Jerrol Banana., MD  ? 1 year ago Annual physical exam  ? [facility] [place], [place]arn[place], [place]  ? 3 years ago Annual physical exam  ? [facility] [place], [place], [place]  ? 4 years ago Annual physical exam  ? [facility] [place], [place], [place]  ? ?  ?  ? ?  ?  ?  ? ? ? ?

## 2021-12-07 NOTE — Telephone Encounter (Signed)
Attempted to reach patient about refill request but voicemail box was full, if patient returns call she needs to schedule office visit first prior to refills. KW ?

## 2021-12-08 DIAGNOSIS — Z932 Ileostomy status: Secondary | ICD-10-CM | POA: Diagnosis not present

## 2021-12-08 DIAGNOSIS — K5190 Ulcerative colitis, unspecified, without complications: Secondary | ICD-10-CM | POA: Diagnosis not present

## 2021-12-20 DIAGNOSIS — Z932 Ileostomy status: Secondary | ICD-10-CM | POA: Diagnosis not present

## 2021-12-20 DIAGNOSIS — K5190 Ulcerative colitis, unspecified, without complications: Secondary | ICD-10-CM | POA: Diagnosis not present

## 2021-12-26 DIAGNOSIS — E039 Hypothyroidism, unspecified: Secondary | ICD-10-CM | POA: Diagnosis not present

## 2021-12-26 DIAGNOSIS — Z1331 Encounter for screening for depression: Secondary | ICD-10-CM | POA: Diagnosis not present

## 2021-12-26 DIAGNOSIS — Z1330 Encounter for screening examination for mental health and behavioral disorders, unspecified: Secondary | ICD-10-CM | POA: Diagnosis not present

## 2021-12-26 DIAGNOSIS — F339 Major depressive disorder, recurrent, unspecified: Secondary | ICD-10-CM | POA: Diagnosis not present

## 2022-01-05 DIAGNOSIS — J45909 Unspecified asthma, uncomplicated: Secondary | ICD-10-CM | POA: Diagnosis not present

## 2022-01-05 DIAGNOSIS — Z9109 Other allergy status, other than to drugs and biological substances: Secondary | ICD-10-CM | POA: Diagnosis not present

## 2022-01-05 DIAGNOSIS — J4540 Moderate persistent asthma, uncomplicated: Secondary | ICD-10-CM | POA: Diagnosis not present

## 2022-01-18 DIAGNOSIS — K5190 Ulcerative colitis, unspecified, without complications: Secondary | ICD-10-CM | POA: Diagnosis not present

## 2022-01-18 DIAGNOSIS — Z932 Ileostomy status: Secondary | ICD-10-CM | POA: Diagnosis not present

## 2022-01-26 DIAGNOSIS — K5190 Ulcerative colitis, unspecified, without complications: Secondary | ICD-10-CM | POA: Diagnosis not present

## 2022-01-26 DIAGNOSIS — Z8719 Personal history of other diseases of the digestive system: Secondary | ICD-10-CM | POA: Diagnosis not present

## 2022-02-09 DIAGNOSIS — Z1231 Encounter for screening mammogram for malignant neoplasm of breast: Secondary | ICD-10-CM | POA: Diagnosis not present

## 2022-02-09 DIAGNOSIS — Z803 Family history of malignant neoplasm of breast: Secondary | ICD-10-CM | POA: Diagnosis not present

## 2022-02-09 DIAGNOSIS — Z006 Encounter for examination for normal comparison and control in clinical research program: Secondary | ICD-10-CM | POA: Diagnosis not present

## 2022-02-09 DIAGNOSIS — Z6822 Body mass index (BMI) 22.0-22.9, adult: Secondary | ICD-10-CM | POA: Diagnosis not present

## 2022-02-09 DIAGNOSIS — Z932 Ileostomy status: Secondary | ICD-10-CM | POA: Diagnosis not present

## 2022-02-09 DIAGNOSIS — K5190 Ulcerative colitis, unspecified, without complications: Secondary | ICD-10-CM | POA: Diagnosis not present

## 2022-02-28 DIAGNOSIS — Z932 Ileostomy status: Secondary | ICD-10-CM | POA: Diagnosis not present

## 2022-02-28 DIAGNOSIS — K5190 Ulcerative colitis, unspecified, without complications: Secondary | ICD-10-CM | POA: Diagnosis not present

## 2022-03-27 DIAGNOSIS — N951 Menopausal and female climacteric states: Secondary | ICD-10-CM | POA: Diagnosis not present

## 2022-03-27 DIAGNOSIS — F339 Major depressive disorder, recurrent, unspecified: Secondary | ICD-10-CM | POA: Diagnosis not present

## 2022-03-27 DIAGNOSIS — M549 Dorsalgia, unspecified: Secondary | ICD-10-CM | POA: Diagnosis not present

## 2022-04-10 DIAGNOSIS — L814 Other melanin hyperpigmentation: Secondary | ICD-10-CM | POA: Diagnosis not present

## 2022-04-10 DIAGNOSIS — D2260 Melanocytic nevi of unspecified upper limb, including shoulder: Secondary | ICD-10-CM | POA: Diagnosis not present

## 2022-04-10 DIAGNOSIS — L578 Other skin changes due to chronic exposure to nonionizing radiation: Secondary | ICD-10-CM | POA: Diagnosis not present

## 2022-04-10 DIAGNOSIS — L821 Other seborrheic keratosis: Secondary | ICD-10-CM | POA: Diagnosis not present

## 2022-04-12 DIAGNOSIS — Z7189 Other specified counseling: Secondary | ICD-10-CM | POA: Diagnosis not present

## 2022-04-12 DIAGNOSIS — Z9049 Acquired absence of other specified parts of digestive tract: Secondary | ICD-10-CM | POA: Diagnosis not present

## 2022-04-12 DIAGNOSIS — T8189XD Other complications of procedures, not elsewhere classified, subsequent encounter: Secondary | ICD-10-CM | POA: Diagnosis not present

## 2022-04-12 DIAGNOSIS — Z79899 Other long term (current) drug therapy: Secondary | ICD-10-CM | POA: Diagnosis not present

## 2022-04-12 DIAGNOSIS — Z932 Ileostomy status: Secondary | ICD-10-CM | POA: Diagnosis not present

## 2022-04-12 DIAGNOSIS — Y838 Other surgical procedures as the cause of abnormal reaction of the patient, or of later complication, without mention of misadventure at the time of the procedure: Secondary | ICD-10-CM | POA: Diagnosis not present

## 2022-04-16 DIAGNOSIS — Z8719 Personal history of other diseases of the digestive system: Secondary | ICD-10-CM | POA: Diagnosis not present

## 2022-04-16 DIAGNOSIS — K91850 Pouchitis: Secondary | ICD-10-CM | POA: Diagnosis not present

## 2022-05-03 DIAGNOSIS — R5383 Other fatigue: Secondary | ICD-10-CM | POA: Diagnosis not present

## 2022-05-03 DIAGNOSIS — E860 Dehydration: Secondary | ICD-10-CM | POA: Diagnosis not present

## 2022-05-03 DIAGNOSIS — K9419 Other complications of enterostomy: Secondary | ICD-10-CM | POA: Diagnosis not present

## 2022-05-03 DIAGNOSIS — R42 Dizziness and giddiness: Secondary | ICD-10-CM | POA: Diagnosis not present

## 2022-06-04 DIAGNOSIS — Z932 Ileostomy status: Secondary | ICD-10-CM | POA: Diagnosis not present

## 2022-06-04 DIAGNOSIS — K5190 Ulcerative colitis, unspecified, without complications: Secondary | ICD-10-CM | POA: Diagnosis not present

## 2022-06-18 DIAGNOSIS — K91850 Pouchitis: Secondary | ICD-10-CM | POA: Diagnosis not present

## 2022-06-18 DIAGNOSIS — Z8719 Personal history of other diseases of the digestive system: Secondary | ICD-10-CM | POA: Diagnosis not present

## 2022-06-20 DIAGNOSIS — L929 Granulomatous disorder of the skin and subcutaneous tissue, unspecified: Secondary | ICD-10-CM | POA: Diagnosis not present

## 2022-06-20 DIAGNOSIS — Z932 Ileostomy status: Secondary | ICD-10-CM | POA: Diagnosis not present

## 2022-07-24 DIAGNOSIS — Z932 Ileostomy status: Secondary | ICD-10-CM | POA: Diagnosis not present

## 2022-07-24 DIAGNOSIS — K5190 Ulcerative colitis, unspecified, without complications: Secondary | ICD-10-CM | POA: Diagnosis not present

## 2022-08-16 DIAGNOSIS — Z8719 Personal history of other diseases of the digestive system: Secondary | ICD-10-CM | POA: Diagnosis not present

## 2022-08-16 DIAGNOSIS — K91850 Pouchitis: Secondary | ICD-10-CM | POA: Diagnosis not present

## 2023-05-17 ENCOUNTER — Emergency Department: Payer: BC Managed Care – PPO

## 2023-05-17 ENCOUNTER — Emergency Department
Admission: EM | Admit: 2023-05-17 | Discharge: 2023-05-17 | Disposition: A | Discharge status: Home / Self Care | Payer: BC Managed Care – PPO | Attending: Emergency Medicine | Admitting: Emergency Medicine

## 2023-05-17 ENCOUNTER — Other Ambulatory Visit: Payer: Self-pay

## 2023-05-17 DIAGNOSIS — J45909 Unspecified asthma, uncomplicated: Secondary | ICD-10-CM | POA: Diagnosis not present

## 2023-05-17 DIAGNOSIS — S3992XA Unspecified injury of lower back, initial encounter: Secondary | ICD-10-CM | POA: Diagnosis present

## 2023-05-17 DIAGNOSIS — M545 Low back pain, unspecified: Secondary | ICD-10-CM

## 2023-05-17 DIAGNOSIS — S39012A Strain of muscle, fascia and tendon of lower back, initial encounter: Secondary | ICD-10-CM | POA: Diagnosis not present

## 2023-05-17 DIAGNOSIS — W19XXXA Unspecified fall, initial encounter: Secondary | ICD-10-CM | POA: Insufficient documentation

## 2023-05-17 MED ORDER — ACETAMINOPHEN 500 MG PO TABS
1000.0000 mg | ORAL_TABLET | Freq: Once | ORAL | Status: AC
  Administered 2023-05-17: 1000 mg via ORAL
  Filled 2023-05-17: qty 2

## 2023-05-17 MED ORDER — METHOCARBAMOL 500 MG PO TABS
500.0000 mg | ORAL_TABLET | Freq: Once | ORAL | Status: AC
  Administered 2023-05-17: 500 mg via ORAL
  Filled 2023-05-17: qty 1

## 2023-05-17 MED ORDER — METOCLOPRAMIDE HCL 5 MG/ML IJ SOLN
10.0000 mg | Freq: Once | INTRAMUSCULAR | Status: AC
  Administered 2023-05-17: 10 mg via INTRAVENOUS
  Filled 2023-05-17: qty 2

## 2023-05-17 MED ORDER — KETOROLAC TROMETHAMINE 30 MG/ML IJ SOLN
15.0000 mg | Freq: Once | INTRAMUSCULAR | Status: AC
  Administered 2023-05-17: 15 mg via INTRAVENOUS
  Filled 2023-05-17: qty 1

## 2023-05-17 MED ORDER — LIDOCAINE 5 % EX PTCH
1.0000 | MEDICATED_PATCH | CUTANEOUS | Status: DC
  Administered 2023-05-17: 1 via TRANSDERMAL
  Filled 2023-05-17: qty 1

## 2023-05-17 MED ORDER — DIAZEPAM 5 MG/ML IJ SOLN
2.5000 mg | Freq: Once | INTRAMUSCULAR | Status: AC
  Administered 2023-05-17: 2.5 mg via INTRAVENOUS
  Filled 2023-05-17: qty 2

## 2023-05-17 MED ORDER — LIDOCAINE 5 % EX PTCH: 1.0000 | MEDICATED_PATCH | Freq: Two times a day (BID) | CUTANEOUS | 0 refills | Status: AC

## 2023-05-17 MED ORDER — ONDANSETRON HCL 4 MG/2ML IJ SOLN
4.0000 mg | Freq: Once | INTRAMUSCULAR | Status: AC
  Administered 2023-05-17: 4 mg via INTRAVENOUS
  Filled 2023-05-17: qty 2

## 2023-05-17 NOTE — Discharge Instructions (Addendum)
Use Tylenol for pain and fevers.  Up to 1000 mg per dose, up to 4 times per day.  Do not take more than 4000 mg of Tylenol/acetaminophen within 24 hours..  Please use lidocaine patches at your site of pain.  Apply 1 patch at a time, leave on for 12 hours, then remove for 12 hours.  12 hours on, 12 hours off.  Do not apply more than 1 patch at a time.

## 2023-05-17 NOTE — ED Provider Notes (Signed)
[facility] Provider Note    Event Date/Time   First MD Initiated Contact with Patient 05/17/23 0007     (approximate)   History   Back Pain   HPI  Mariah Nguyen is a 52 y.o. female who presents to the ED for evaluation of Back Pain   Reviewed PCP visit from May.  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  Reports a lot of travel today as her son is the kicker for an [employer] team.  Reports that she was reaching forward into a car while standing to help unpack it when she developed sudden lower back pain causing her to fall forward and subsequently with such severe pain that she is unable to ambulate.  No falls backward onto the back or any direct trauma   Physical Exam   Triage Vital Signs: ED Triage Vitals  Encounter Vitals Group     BP      Systolic BP Percentile      Diastolic BP Percentile      Pulse      Resp      Temp      Temp src      SpO2      Weight      Height      Head Circumference      Peak Flow      Pain Score      Pain Loc      Pain Education      Exclude from Growth Chart     Most recent vital signs: Vitals:   05/17/23 0017 05/17/23 0030  BP:  127/87  Pulse:  81  Resp:  16  Temp: 99.4 F (37.4 C)   SpO2:  100%    General: Awake, no distress.  Supine, anxious and tremulous CV:  Good peripheral perfusion.  Resp:  Normal effort.  Abd:  No distention.  Ostomy bag, soft abdomen MSK:  No deformity noted.  Neuro:  No focal deficits appreciated.  Sensation intact to bilateral legs and strength intact to bilateral feet Other:     ED Results / Procedures / Treatments   Labs (all labs ordered are listed, but only abnormal results are displayed) Labs Reviewed - No data to display  EKG   RADIOLOGY   Official radiology report(s): MR LUMBAR SPINE WO CONTRAST  Result Date: 05/17/2023 CLINICAL DATA:   Sudden severe back pain EXAM: MRI LUMBAR SPINE WITHOUT CONTRAST TECHNIQUE: Multiplanar, multisequence MR imaging of the lumbar spine was performed. No intravenous contrast was administered. COMPARISON:  None Available. FINDINGS: The patient was unable to tolerate the full length of the examination. Only sagittal sequences were completed. Segmentation: Standard Alignment:  Normal Vertebrae:  No fracture, evidence of discitis, or bone lesion. Conus medullaris and cauda equina: Conus extends to the L1 level. Conus and cauda equina appear normal. Paraspinal and other soft tissues: Limited visualization but unremarkable. Disc levels: No spinal canal stenosis. No nerve root impingement. There is a small disc herniation at L5-S1. IMPRESSION: 1. Incomplete examination with only sagittal sequences obtained. 2. No spinal canal stenosis or nerve root impingement. 3. Small disc herniation at L5-S1. Electronically Signed   By: Deatra Robinson M.D.   On: 05/17/2023 03:04    PROCEDURES and INTERVENTIONS:  Procedures  Medications  lidocaine (LIDODERM) 5 % 1 patch (1 patch Transdermal Patch  Applied 05/17/23 0025)  acetaminophen (TYLENOL) tablet 1,000 mg (1,000 mg Oral Given 05/17/23 0025)  ketorolac (TORADOL) 30 MG/ML injection 15 mg (15 mg Intravenous Given 05/17/23 0023)  methocarbamol (ROBAXIN) tablet 500 mg (500 mg Oral Given 05/17/23 0025)  diazepam (VALIUM) injection 2.5 mg (2.5 mg Intravenous Given 05/17/23 0023)  metoCLOPramide (REGLAN) injection 10 mg (10 mg Intravenous Given 05/17/23 0220)  ondansetron (ZOFRAN) injection 4 mg (4 mg Intravenous Given 05/17/23 0220)     IMPRESSION / MDM / ASSESSMENT AND PLAN / ED COURSE  I reviewed the triage vital signs and the nursing notes.  Differential diagnosis includes, but is not limited to, cauda equina, fracture, disc protrusion, muscular strain  {Patient presents with symptoms of an acute illness or injury that is potentially life-threatening.  Patient presents with  severe lower back pain of likely muscular versus disc etiology, without red flag features and ultimately suitable for outpatient management.  Due to the severity of her sudden pain we do attempt a get an MRI but she does not tolerate this and we only have partial imaging that does not show any clear cord compression or significant derangements beyond a small disc protrusion.  She is neurologically intact and ultimately ambulating and suitable for outpatient management.  Clinical Course as of 05/17/23 0351  Fri May 17, 2023  0123 Reassessed and discussed plan of care [DS]  0203 MRI calls and asking for antiemetics  [DS]  0327 Reassessed, she is freely moving her legs and gets up out of bed independently after up at the side rail down.  Walks over to the restroom to void.  Reports feeling better.  Asking if she can go ahead and call her husband.  We discussed limited MRI results [DS]    Clinical Course User Index [DS] Delton Prairie, MD     FINAL CLINICAL IMPRESSION(S) / ED DIAGNOSES   Final diagnoses:  Strain of lumbar region, initial encounter  Acute bilateral low back pain without sciatica     Rx / DC Orders   ED Discharge Orders          Ordered    lidocaine (LIDODERM) 5 %  Every 12 hours        05/17/23 0329             Note:  This document was prepared using Dragon voice recognition software and may include unintentional dictation errors.   Delton Prairie, MD 05/17/23 [phone]

## 2023-05-17 NOTE — ED Triage Notes (Signed)
Pt BIB for lower back pain that started tonight after bending over while unloading her car. Pt denies hitting her back. Pt able to move all extremities, equal strengths. Pt appears anxious on arrival, rapidly moving her legs and speaking quickly. MD Katrinka Blazing at bedside on arrival for eval. EMS started IV.  Pt took Rx'ed cyclobenzaprine PTA

## 2023-05-20 ENCOUNTER — Emergency Department: Payer: BC Managed Care – PPO

## 2023-05-20 ENCOUNTER — Encounter: Payer: Self-pay | Admitting: *Deleted

## 2023-05-20 ENCOUNTER — Other Ambulatory Visit: Payer: Self-pay

## 2023-05-20 ENCOUNTER — Emergency Department
Admission: EM | Admit: 2023-05-20 | Discharge: 2023-05-20 | Disposition: A | Discharge status: Home / Self Care | Payer: BC Managed Care – PPO | Attending: Emergency Medicine | Admitting: Emergency Medicine

## 2023-05-20 DIAGNOSIS — M5127 Other intervertebral disc displacement, lumbosacral region: Secondary | ICD-10-CM

## 2023-05-20 DIAGNOSIS — M5126 Other intervertebral disc displacement, lumbar region: Secondary | ICD-10-CM | POA: Diagnosis not present

## 2023-05-20 DIAGNOSIS — M549 Dorsalgia, unspecified: Secondary | ICD-10-CM | POA: Diagnosis present

## 2023-05-20 MED ORDER — DIAZEPAM 5 MG/ML IJ SOLN
2.5000 mg | Freq: Once | INTRAMUSCULAR | Status: AC
  Administered 2023-05-20: 2.5 mg via INTRAMUSCULAR
  Filled 2023-05-20: qty 2

## 2023-05-20 NOTE — ED Notes (Signed)
Pt took a Flexeril around 15 min ago at this time.

## 2023-05-20 NOTE — ED Triage Notes (Signed)
Pt was seen here for back pain on 9/13 and refused MRI, she is back as she continues to have pain and wants MRI

## 2023-05-20 NOTE — ED Provider Notes (Signed)
[facility] Provider Note    Event Date/Time   First MD Initiated Contact with Patient 05/20/23 1126     (approximate)   History   Back Pain   HPI  Mariah Nguyen is a 52 y.o. female   with PMH of ulcerative colitis s/p total proctocolectomy with J-pouch presents for evaluation of back pain.  Patient was seen by this ED on 9/13 for the same.  She was bending over to pick something up when she had sudden onset of severe pain.  She was unable to complete the MRI due to her pain and feeling like she was "being cooked alive" inside the MRI machine.  Patient is returning to the ED today to have the MRI completed.  Patient reports that for the past 4 days she has just delayed in a chair and not moved except to get up to go to the bathroom.  She describes the pain as being in the middle of her low back and sharp.  There is no radiation down her legs.  No changes in her ability to urinate.  Patient walking using a walker.      Physical Exam   Triage Vital Signs: ED Triage Vitals  Encounter Vitals Group     BP 05/20/23 1108 (!) 121/102     Systolic BP Percentile --      Diastolic BP Percentile --      Pulse Rate 05/20/23 1108 81     Resp 05/20/23 1108 (!) 21     Temp 05/20/23 1108 98.2 F (36.8 C)     Temp Source 05/20/23 1108 Oral     SpO2 05/20/23 1108 98 %     Weight 05/20/23 1216 125 lb 14.1 oz (57.1 kg)     Height 05/20/23 1216 5\' 3"  (1.6 m)     Head Circumference --      Peak Flow --      Pain Score 05/20/23 1109 10     Pain Loc --      Pain Education --      Exclude from Growth Chart --     Most recent vital signs: Vitals:   05/20/23 1108  BP: (!) 121/102  Pulse: 81  Resp: (!) 21  Temp: 98.2 F (36.8 C)  SpO2: 98%    General: Awake, no distress.  CV:  Good peripheral perfusion. RRR. Resp:  Normal effort. CTAB. Abd:  No distention.  Other:  No focal neurodeficits.  Sensation intact across all dermatomes of the lower extremities.   Patient able to walk.   ED Results / Procedures / Treatments   Labs (all labs ordered are listed, but only abnormal results are displayed) Labs Reviewed - No data to display   RADIOLOGY  MRI of the lumbar spine obtained, interpreted the images as well as reviewed the radiologist report, patient has a right subarticular disc protrusion at the L5-S1 displacing the traversing right S1 nerve root in the subarticular zone.  PROCEDURES:  Critical Care performed: No  Procedures   MEDICATIONS ORDERED IN ED: Medications  diazepam (VALIUM) injection 2.5 mg (2.5 mg Intramuscular Given 05/20/23 1216)     IMPRESSION / MDM / ASSESSMENT AND PLAN / ED COURSE  I reviewed the triage vital signs and the nursing notes.                             52 year old female presents for evaluation of acute onset  of back pain.  Patient was hypertensive in triage otherwise vital signs stable.  Patient NAD on exam.  Differential diagnosis includes, but is not limited to, muscle strain, herniated disc, lumbar radiculopathy, cauda equina syndrome, lumbar fracture.  Patient's presentation is most consistent with acute complicated illness / injury requiring diagnostic workup.  MRI of the lumbar spine obtained, interpreted the images as well as reviewed the radiologist report.  Patient has a right subarticular disc protrusion at the L5-S1, displacing traversing right S1 nerve root in the subarticular zone.  Patient made aware of her results.  I explained that she will need to follow-up with neurosurgery.  She requested a copy of her MRI results on a disc and she received a paper printout.  Advised patient to continue taking her muscle relaxer and Tylenol as needed.  She has a follow-up with her PCP scheduled for tomorrow.  Given that patient does not have any neurodeficits and is able to walk, I feel she stable for discharge.  Patient was agreeable to plan, voiced understanding and was stable at discharge.      FINAL CLINICAL IMPRESSION(S) / ED DIAGNOSES   Final diagnoses:  Protrusion of intervertebral disc of lumbosacral region     Rx / DC Orders   ED Discharge Orders     None        Note:  This document was prepared using Dragon voice recognition software and may include unintentional dictation errors.   Cameron Ali, PA-C 05/20/23 1523    Jene Every, MD 05/20/23 1531

## 2023-05-20 NOTE — Discharge Instructions (Signed)
Please follow-up with Dr. Marcell Barlow or a neurosurgeon of your choice.  You can return to the ED with any new or worsening symptoms.

## 2023-05-20 NOTE — ED Triage Notes (Signed)
First Nurse Note: Patient to ED via POV from [facility] for back pain. States seen for same on 9/13.
